# Patient Record
Sex: Female | Born: 1970
Health system: Southern US, Community
[De-identification: ages and names within clinical notes are randomized; demographics above are authoritative.]

## PROBLEM LIST (undated history)

## (undated) DIAGNOSIS — I10 Essential (primary) hypertension: Secondary | ICD-10-CM

## (undated) DIAGNOSIS — D649 Anemia, unspecified: Secondary | ICD-10-CM

## (undated) DIAGNOSIS — F32A Depression, unspecified: Secondary | ICD-10-CM

## (undated) DIAGNOSIS — F419 Anxiety disorder, unspecified: Secondary | ICD-10-CM

## (undated) DIAGNOSIS — K219 Gastro-esophageal reflux disease without esophagitis: Secondary | ICD-10-CM

## (undated) DIAGNOSIS — T7840XA Allergy, unspecified, initial encounter: Secondary | ICD-10-CM

## (undated) DIAGNOSIS — F329 Major depressive disorder, single episode, unspecified: Secondary | ICD-10-CM

## (undated) HISTORY — DX: Gastro-esophageal reflux disease without esophagitis: K21.9

## (undated) HISTORY — PX: WISDOM TOOTH EXTRACTION: SHX21

## (undated) HISTORY — DX: Anxiety disorder, unspecified: F41.9

## (undated) HISTORY — DX: Allergy, unspecified, initial encounter: T78.40XA

## (undated) HISTORY — DX: Major depressive disorder, single episode, unspecified: F32.9

## (undated) HISTORY — DX: Depression, unspecified: F32.A

## (undated) HISTORY — PX: CHOLECYSTECTOMY: SHX55

---

## 2005-08-31 ENCOUNTER — Emergency Department (HOSPITAL_COMMUNITY): Admission: EM | Admit: 2005-08-31 | Discharge: 2005-08-31 | Payer: Self-pay | Admitting: Emergency Medicine

## 2005-10-02 ENCOUNTER — Emergency Department (HOSPITAL_COMMUNITY): Admission: EM | Admit: 2005-10-02 | Discharge: 2005-10-02 | Payer: Self-pay | Admitting: Family Medicine

## 2008-03-22 ENCOUNTER — Emergency Department (HOSPITAL_BASED_OUTPATIENT_CLINIC_OR_DEPARTMENT_OTHER): Admission: EM | Admit: 2008-03-22 | Discharge: 2008-03-22 | Payer: Self-pay | Admitting: Emergency Medicine

## 2010-04-19 LAB — URINALYSIS, ROUTINE W REFLEX MICROSCOPIC
Bilirubin Urine: NEGATIVE
Glucose, UA: NEGATIVE mg/dL
Hgb urine dipstick: NEGATIVE
Ketones, ur: NEGATIVE mg/dL
Nitrite: NEGATIVE
Protein, ur: NEGATIVE mg/dL
Specific Gravity, Urine: 1.025 (ref 1.005–1.030)
Urobilinogen, UA: 1 mg/dL (ref 0.0–1.0)
pH: 6.5 (ref 5.0–8.0)

## 2010-04-19 LAB — DIFFERENTIAL
Basophils Absolute: 0.1 10*3/uL (ref 0.0–0.1)
Basophils Relative: 1 % (ref 0–1)
Eosinophils Absolute: 0.1 10*3/uL (ref 0.0–0.7)
Eosinophils Relative: 1 % (ref 0–5)
Lymphocytes Relative: 10 % — ABNORMAL LOW (ref 12–46)
Lymphs Abs: 1.1 10*3/uL (ref 0.7–4.0)
Monocytes Absolute: 0.5 10*3/uL (ref 0.1–1.0)
Monocytes Relative: 5 % (ref 3–12)
Neutro Abs: 9.1 10*3/uL — ABNORMAL HIGH (ref 1.7–7.7)
Neutrophils Relative %: 84 % — ABNORMAL HIGH (ref 43–77)

## 2010-04-19 LAB — URINE MICROSCOPIC-ADD ON

## 2010-04-19 LAB — BASIC METABOLIC PANEL
BUN: 14 mg/dL (ref 6–23)
CO2: 25 mEq/L (ref 19–32)
Calcium: 8.7 mg/dL (ref 8.4–10.5)
Chloride: 104 mEq/L (ref 96–112)
Creatinine, Ser: 0.8 mg/dL (ref 0.4–1.2)
GFR calc Af Amer: >60 mL/min (ref >60)
GFR calc non Af Amer: >60 mL/min (ref >60)
Glucose, Bld: 95 mg/dL (ref 70–99)
Potassium: 4.3 mEq/L (ref 3.5–5.1)
Sodium: 137 mEq/L (ref 135–145)

## 2010-04-19 LAB — CBC
HCT: 40.3 % (ref 36.0–46.0)
Hemoglobin: 13.1 g/dL (ref 12.0–15.0)
MCHC: 32.6 g/dL (ref 30.0–36.0)
MCV: 82 fL (ref 78.0–100.0)
Platelets: 208 10*3/uL (ref 150–400)
RBC: 4.92 MIL/uL (ref 3.87–5.11)
RDW: 13.3 % (ref 11.5–15.5)
WBC: 10.9 10*3/uL — ABNORMAL HIGH (ref 4.0–10.5)

## 2010-04-19 LAB — PREGNANCY, URINE: Preg Test, Ur: NEGATIVE

## 2011-01-26 ENCOUNTER — Ambulatory Visit (INDEPENDENT_AMBULATORY_CARE_PROVIDER_SITE_OTHER): Payer: BC Managed Care – PPO

## 2011-01-26 DIAGNOSIS — R030 Elevated blood-pressure reading, without diagnosis of hypertension: Secondary | ICD-10-CM

## 2011-01-26 DIAGNOSIS — K219 Gastro-esophageal reflux disease without esophagitis: Secondary | ICD-10-CM

## 2011-01-26 DIAGNOSIS — Z0000 Encounter for general adult medical examination without abnormal findings: Secondary | ICD-10-CM

## 2011-01-26 DIAGNOSIS — E669 Obesity, unspecified: Secondary | ICD-10-CM

## 2011-01-26 DIAGNOSIS — D649 Anemia, unspecified: Secondary | ICD-10-CM

## 2011-02-26 ENCOUNTER — Telehealth: Payer: Self-pay

## 2011-02-26 NOTE — Telephone Encounter (Signed)
Pt states that Dr Alwyn Ren put her on an anti-biotic and she now thinks that she has a yeast infection from it.  Pt would like to know if she could have something called in for it.

## 2011-02-28 MED ORDER — FLUCONAZOLE 150 MG PO TABS: 150.0000 mg | ORAL_TABLET | Freq: Once | ORAL | Status: AC

## 2011-02-28 NOTE — Telephone Encounter (Signed)
Rx for Diflucan sent.

## 2011-02-28 NOTE — Telephone Encounter (Signed)
Pt.notified

## 2011-05-14 ENCOUNTER — Ambulatory Visit (INDEPENDENT_AMBULATORY_CARE_PROVIDER_SITE_OTHER): Payer: BC Managed Care – PPO | Admitting: Internal Medicine

## 2011-05-14 VITALS — BP 151/96 | HR 95 | Temp 98.3°F | Resp 18 | Ht 66.0 in | Wt 218.2 lb

## 2011-05-14 DIAGNOSIS — L299 Pruritus, unspecified: Secondary | ICD-10-CM

## 2011-05-14 DIAGNOSIS — T148 Other injury of unspecified body region: Secondary | ICD-10-CM

## 2011-05-14 DIAGNOSIS — K219 Gastro-esophageal reflux disease without esophagitis: Secondary | ICD-10-CM | POA: Insufficient documentation

## 2011-05-14 DIAGNOSIS — W57XXXA Bitten or stung by nonvenomous insect and other nonvenomous arthropods, initial encounter: Secondary | ICD-10-CM

## 2011-05-14 DIAGNOSIS — R21 Rash and other nonspecific skin eruption: Secondary | ICD-10-CM

## 2011-05-14 MED ORDER — CLOBETASOL PROPIONATE 0.05 % EX CREA: TOPICAL_CREAM | Freq: Two times a day (BID) | CUTANEOUS | Status: AC

## 2011-05-14 MED ORDER — PREDNISONE 10 MG PO TABS: ORAL_TABLET | ORAL | Status: DC

## 2011-05-14 NOTE — Progress Notes (Signed)
  Subjective:    Patient ID: Angela Brooks, female    DOB: March 06, 1970, 41 y.o.   MRN: [id number]  HPI At a friends house and later noticed an itchy rash starting on legs and moving to arms.  Most areas are discrete bites, some are confluent. No fever and not sick. Very itchy   Review of Systems BP elevated will f/up with Dr. Alwyn Ren    Objective:   Physical Exam Rash on legs and arms typical of insect bites. Appears well otherwise       Assessment & Plan:  Zyrtec Clobetasol Prednisone po

## 2013-08-21 ENCOUNTER — Emergency Department (HOSPITAL_COMMUNITY)
Admission: EM | Admit: 2013-08-21 | Discharge: 2013-08-22 | Disposition: A | Discharge status: Home / Self Care | Payer: Self-pay | Attending: Emergency Medicine | Admitting: Emergency Medicine

## 2013-08-21 ENCOUNTER — Emergency Department (HOSPITAL_COMMUNITY): Payer: Self-pay

## 2013-08-21 ENCOUNTER — Encounter (HOSPITAL_COMMUNITY): Payer: Self-pay | Admitting: Emergency Medicine

## 2013-08-21 DIAGNOSIS — R079 Chest pain, unspecified: Secondary | ICD-10-CM | POA: Insufficient documentation

## 2013-08-21 DIAGNOSIS — Z79899 Other long term (current) drug therapy: Secondary | ICD-10-CM | POA: Insufficient documentation

## 2013-08-21 DIAGNOSIS — R252 Cramp and spasm: Secondary | ICD-10-CM | POA: Insufficient documentation

## 2013-08-21 DIAGNOSIS — Z3202 Encounter for pregnancy test, result negative: Secondary | ICD-10-CM | POA: Insufficient documentation

## 2013-08-21 DIAGNOSIS — R0789 Other chest pain: Secondary | ICD-10-CM | POA: Insufficient documentation

## 2013-08-21 DIAGNOSIS — I10 Essential (primary) hypertension: Secondary | ICD-10-CM | POA: Insufficient documentation

## 2013-08-21 HISTORY — DX: Essential (primary) hypertension: I10

## 2013-08-21 LAB — CBC
HCT: 31.9 % — ABNORMAL LOW (ref 36.0–46.0)
Hemoglobin: 9.7 g/dL — ABNORMAL LOW (ref 12.0–15.0)
MCH: 21.7 pg — ABNORMAL LOW (ref 26.0–34.0)
MCHC: 30.4 g/dL (ref 30.0–36.0)
MCV: 71.4 fL — ABNORMAL LOW (ref 78.0–100.0)
Platelets: 289 10*3/uL (ref 150–400)
RBC: 4.47 MIL/uL (ref 3.87–5.11)
RDW: 15.5 % (ref 11.5–15.5)
WBC: 13.6 10*3/uL — ABNORMAL HIGH (ref 4.0–10.5)

## 2013-08-21 LAB — BASIC METABOLIC PANEL
Anion gap: 14 (ref 5–15)
BUN: 10 mg/dL (ref 6–23)
CO2: 24 mEq/L (ref 19–32)
Calcium: 9.5 mg/dL (ref 8.4–10.5)
Chloride: 100 mEq/L (ref 96–112)
Creatinine, Ser: 0.99 mg/dL (ref 0.50–1.10)
GFR calc Af Amer: 79 mL/min — ABNORMAL LOW (ref >90)
GFR calc non Af Amer: 68 mL/min — ABNORMAL LOW (ref >90)
Glucose, Bld: 105 mg/dL — ABNORMAL HIGH (ref 70–99)
Potassium: 4.4 mEq/L (ref 3.7–5.3)
Sodium: 138 mEq/L (ref 137–147)

## 2013-08-21 LAB — POC URINE PREG, ED: Preg Test, Ur: NEGATIVE

## 2013-08-21 LAB — I-STAT TROPONIN, ED: Troponin i, poc: 0.02 ng/mL (ref 0.00–0.08)

## 2013-08-21 LAB — PRO B NATRIURETIC PEPTIDE: Pro B Natriuretic peptide (BNP): 49.7 pg/mL (ref 0–125)

## 2013-08-21 NOTE — ED Notes (Signed)
Pt states this afternoon she started to develop cramps in bilateral legs. States that cramps were so bad she called 911 who noted her BP to high. Pt states that she was able to walk at this point, so had family bring her to ED. While driving here she developed central chest "squeezing" with radiation to right arm, associated with mild SOB and weakness. Pt noted to be hypertensive, hx of same, denies taking any medications.

## 2013-08-21 NOTE — ED Notes (Addendum)
Pt asking to be XXX.  Requesting NO visitors.  The pt is advising the female party that is with her she does not want back either.

## 2013-08-22 LAB — URINALYSIS, ROUTINE W REFLEX MICROSCOPIC
Bilirubin Urine: NEGATIVE
Glucose, UA: NEGATIVE mg/dL
Hgb urine dipstick: NEGATIVE
Ketones, ur: NEGATIVE mg/dL
Leukocytes, UA: NEGATIVE
Nitrite: NEGATIVE
Protein, ur: NEGATIVE mg/dL
Specific Gravity, Urine: 1.009 (ref 1.005–1.030)
Urobilinogen, UA: 0.2 mg/dL (ref 0.0–1.0)
pH: 7 (ref 5.0–8.0)

## 2013-08-22 LAB — D-DIMER, QUANTITATIVE: D-Dimer, Quant: 0.37 ug/mL-FEU (ref 0.00–0.48)

## 2013-08-22 MED ORDER — NITROGLYCERIN 2 % TD OINT
1.0000 [in_us] | TOPICAL_OINTMENT | Freq: Once | TRANSDERMAL | Status: AC
  Administered 2013-08-22: 1 [in_us] via TOPICAL
  Filled 2013-08-22: qty 1

## 2013-08-22 MED ORDER — ASPIRIN 81 MG PO CHEW
324.0000 mg | CHEWABLE_TABLET | Freq: Once | ORAL | Status: AC
  Administered 2013-08-22: 324 mg via ORAL
  Filled 2013-08-22: qty 4

## 2013-08-22 NOTE — Discharge Instructions (Signed)

## 2013-08-22 NOTE — ED Provider Notes (Signed)
CSN: [id number]     Arrival date & time 08/21/13  2119 History   First MD Initiated Contact with Patient 08/22/13 0006     Chief Complaint  Patient presents with  . Chest Pain     (Consider location/radiation/quality/duration/timing/severity/associated sxs/prior Treatment) HPI  Patient is a 43 yo woman who presents with complaints of bilateral aching pain in her calves. She says she got a "Charlie horse" in her left calf and then her right after playing softball for about an hour, shortly PTA. She says that, despite, friends rubbing her legs, she was  Unable to shake the cramping which was severe. So, 911 was called. After paramedics arrived, she developed centrally located chest tightness which lasted < 49m and resolved without intervention. No SOB. No cough.   Patient has never played softball before. She reports normal po intake. She has not appreciated any swelling of the LE. She is concerned that she may have a blood clot. Other than HTN, she has no CAD RF. No VTE RF.   Past Medical History  Diagnosis Date  . Hypertension    Past Surgical History  Procedure Laterality Date  . Cholecystectomy     History reviewed. No pertinent family history. History  Substance Use Topics  . Smoking status: Never Smoker   . Smokeless tobacco: Not on file  . Alcohol Use: No   OB History   Grav Para Term Preterm Abortions TAB SAB Ect Mult Living   1    1  1   1      Review of Systems 10 point review of symptoms obtained and is negative with the exceptions of symptoms noted abov.e    Allergies  Review of patient's allergies indicates no known allergies.  Home Medications   Prior to Admission medications   Medication Sig Start Date End Date Taking? Authorizing Provider  pantoprazole (PROTONIX) 40 MG tablet Take 40 mg by mouth daily.    Historical Provider, MD  predniSONE (DELTASONE) 10 MG tablet Take 2 po bid for 3d, take 1 po bid for 3d, take 1 po qd for 2d pc for rash 05/14/11   Orma Flaming, MD   BP 177/105  Pulse 96  Temp(Src) 98.3 F (36.8 C) (Oral)  Resp 18  Ht 5\' 5"  (1.651 m)  Wt 192 lb (87.091 kg)  BMI 31.95 kg/m2  SpO2 100%  LMP 07/24/2013 Physical Exam Gen: well nourished and well developed appearing Head: NCAT Ears: normal to inspection Nose: normal to inspection, no epistaxis or drainage Mouth: oral mucsoa is well hydrated appearing, normal posterior oropharynx Neck: supple, no stridor CV: RRR, no murmur, palpable peripheral pulses Resp: lung sounds are clear to auscultation bilaterally, no wheeing or rhonchi or rales, normal respiratory effort.  Abd: soft, nontender, nondistended Extremities: normal to inspection, LE are  normal to inspection bilaterally, there is mild tenderness on palpation the calves bilaterally, DP pulses palpable bilaterally.  Skin: warm and dry Neuro: CN ii - XII, no focal deficitis Psyche; normal affect, cooperative.   ED Course  Procedures (including critical care time) Labs Review  Results for orders placed during the hospital encounter of 08/21/13 (from the past 24 hour(s))  CBC     Status: Abnormal   Collection Time    08/21/13  9:29 PM      Result Value Ref Range   WBC 13.6 (*) 4.0 - 10.5 K/uL   RBC 4.47  3.87 - 5.11 MIL/uL   Hemoglobin 9.7 (*) 12.0 - 15.0 g/dL  HCT 31.9 (*) 36.0 - 46.0 %   MCV 71.4 (*) 78.0 - 100.0 fL   MCH 21.7 (*) 26.0 - 34.0 pg   MCHC 30.4  30.0 - 36.0 g/dL   RDW 15.5  11.5 - 15.5 %   Platelets 289  150 - 400 K/uL  BASIC METABOLIC PANEL     Status: Abnormal   Collection Time    08/21/13  9:29 PM      Result Value Ref Range   Sodium 138  137 - 147 mEq/L   Potassium 4.4  3.7 - 5.3 mEq/L   Chloride 100  96 - 112 mEq/L   CO2 24  19 - 32 mEq/L   Glucose, Bld 105 (*) 70 - 99 mg/dL   BUN 10  6 - 23 mg/dL   Creatinine, Ser 0.99  0.50 - 1.10 mg/dL   Calcium 9.5  8.4 - 10.5 mg/dL   GFR calc non Af Amer 68 (*) >90 mL/min   GFR calc Af Amer 79 (*) >90 mL/min   Anion gap 14  5 - 15  PRO B  NATRIURETIC PEPTIDE     Status: None   Collection Time    08/21/13  9:29 PM      Result Value Ref Range   Pro B Natriuretic peptide (BNP) 49.7  0 - 125 pg/mL  I-STAT TROPOININ, ED     Status: None   Collection Time    08/21/13  9:55 PM      Result Value Ref Range   Troponin i, poc 0.02  0.00 - 0.08 ng/mL   Comment 3           POC URINE PREG, ED     Status: None   Collection Time    08/21/13 10:43 PM      Result Value Ref Range   Preg Test, Ur NEGATIVE  NEGATIVE  D-DIMER, QUANTITATIVE     Status: None   Collection Time    08/22/13 12:25 AM      Result Value Ref Range   D-Dimer, Quant 0.37  0.00 - 0.48 ug/mL-FEU  URINALYSIS, ROUTINE W REFLEX MICROSCOPIC     Status: None   Collection Time    08/22/13  1:23 AM      Result Value Ref Range   Color, Urine YELLOW  YELLOW   APPearance CLEAR  CLEAR   Specific Gravity, Urine 1.009  1.005 - 1.030   pH 7.0  5.0 - 8.0   Glucose, UA NEGATIVE  NEGATIVE mg/dL   Hgb urine dipstick NEGATIVE  NEGATIVE   Bilirubin Urine NEGATIVE  NEGATIVE   Ketones, ur NEGATIVE  NEGATIVE mg/dL   Protein, ur NEGATIVE  NEGATIVE mg/dL   Urobilinogen, UA 0.2  0.0 - 1.0 mg/dL   Nitrite NEGATIVE  NEGATIVE   Leukocytes, UA NEGATIVE  NEGATIVE     Imaging Review Dg Chest 2 View  08/21/2013   CLINICAL DATA:  Left-sided chest pain.  EXAM: CHEST  2 VIEW  COMPARISON:  08/31/2005  FINDINGS: The heart size and mediastinal contours are within normal limits. Both lungs are clear. The visualized skeletal structures are unremarkable.  IMPRESSION: No active cardiopulmonary disease.   Electronically Signed   By: Rolm Baptise M.D.   On: 08/21/2013 22:38    EKG: nsr, no acute ischemic changes, normal intervals, normal axis, normal qrs complex  MDM   ED work up non-diagnostic and notable for mild leukocytosis. The patient has normal EKG and CXR and troponin. Her d-dimer  is negative allowing [place] to exclude PE in this low pretest prob patient. Patient has ambulated in the ED and  has been asymtpomatic for the past couple of hours. I have discussed her discharge diagnosis with her. She is stable for d/c with plan for outpatient df/u.     Elyn Peers, MD 08/22/13 [phone]

## 2013-11-08 ENCOUNTER — Encounter (HOSPITAL_COMMUNITY): Payer: Self-pay | Admitting: Emergency Medicine

## 2014-02-06 ENCOUNTER — Ambulatory Visit (INDEPENDENT_AMBULATORY_CARE_PROVIDER_SITE_OTHER): Payer: 59 | Admitting: Internal Medicine

## 2014-02-06 ENCOUNTER — Other Ambulatory Visit: Payer: Self-pay | Admitting: Internal Medicine

## 2014-02-06 ENCOUNTER — Ambulatory Visit (INDEPENDENT_AMBULATORY_CARE_PROVIDER_SITE_OTHER): Payer: 59

## 2014-02-06 VITALS — BP 130/80 | HR 124 | Temp 99.3°F | Resp 17 | Ht 66.0 in | Wt 201.0 lb

## 2014-02-06 DIAGNOSIS — Z23 Encounter for immunization: Secondary | ICD-10-CM

## 2014-02-06 DIAGNOSIS — K047 Periapical abscess without sinus: Secondary | ICD-10-CM

## 2014-02-06 DIAGNOSIS — N946 Dysmenorrhea, unspecified: Secondary | ICD-10-CM

## 2014-02-06 DIAGNOSIS — Z0000 Encounter for general adult medical examination without abnormal findings: Secondary | ICD-10-CM

## 2014-02-06 DIAGNOSIS — M25561 Pain in right knee: Secondary | ICD-10-CM

## 2014-02-06 DIAGNOSIS — N920 Excessive and frequent menstruation with regular cycle: Secondary | ICD-10-CM

## 2014-02-06 DIAGNOSIS — K029 Dental caries, unspecified: Secondary | ICD-10-CM | POA: Insufficient documentation

## 2014-02-06 DIAGNOSIS — M25511 Pain in right shoulder: Secondary | ICD-10-CM

## 2014-02-06 DIAGNOSIS — D509 Iron deficiency anemia, unspecified: Secondary | ICD-10-CM

## 2014-02-06 LAB — COMPREHENSIVE METABOLIC PANEL
ALT: 17 U/L (ref 0–35)
AST: 16 U/L (ref 0–37)
Albumin: 3.9 g/dL (ref 3.5–5.2)
Alkaline Phosphatase: 65 U/L (ref 39–117)
BUN: 12 mg/dL (ref 6–23)
CO2: 28 mEq/L (ref 19–32)
Calcium: 9.1 mg/dL (ref 8.4–10.5)
Chloride: 103 mEq/L (ref 96–112)
Creat: 1 mg/dL (ref 0.50–1.10)
Glucose, Bld: 97 mg/dL (ref 70–99)
Potassium: 4.3 mEq/L (ref 3.5–5.3)
Sodium: 137 mEq/L (ref 135–145)
Total Bilirubin: 0.6 mg/dL (ref 0.2–1.2)
Total Protein: 6.9 g/dL (ref 6.0–8.3)

## 2014-02-06 LAB — POCT WET PREP WITH KOH
Clue Cells Wet Prep HPF POC: NEGATIVE
KOH Prep POC: NEGATIVE
Trichomonas, UA: NEGATIVE
Yeast Wet Prep HPF POC: NEGATIVE

## 2014-02-06 LAB — CBC WITH DIFFERENTIAL/PLATELET
Basophils Absolute: 0.1 10*3/uL (ref 0.0–0.1)
Basophils Relative: 1 % (ref 0–1)
Eosinophils Absolute: 0.2 10*3/uL (ref 0.0–0.7)
Eosinophils Relative: 3 % (ref 0–5)
HCT: 33.7 % — ABNORMAL LOW (ref 36.0–46.0)
Hemoglobin: 10.4 g/dL — ABNORMAL LOW (ref 12.0–15.0)
Lymphocytes Relative: 16 % (ref 12–46)
Lymphs Abs: 1.1 10*3/uL (ref 0.7–4.0)
MCH: 21.8 pg — ABNORMAL LOW (ref 26.0–34.0)
MCHC: 30.9 g/dL (ref 30.0–36.0)
MCV: 70.6 fL — ABNORMAL LOW (ref 78.0–100.0)
MPV: 9.8 fL (ref 8.6–12.4)
Monocytes Absolute: 0.6 10*3/uL (ref 0.1–1.0)
Monocytes Relative: 9 % (ref 3–12)
Neutro Abs: 4.7 10*3/uL (ref 1.7–7.7)
Neutrophils Relative %: 71 % (ref 43–77)
Platelets: 304 10*3/uL (ref 150–400)
RBC: 4.77 MIL/uL (ref 3.87–5.11)
RDW: 17.8 % — ABNORMAL HIGH (ref 11.5–15.5)
WBC: 6.6 10*3/uL (ref 4.0–10.5)

## 2014-02-06 LAB — LIPID PANEL
Cholesterol: 141 mg/dL (ref 0–200)
HDL: 48 mg/dL (ref >39)
LDL Cholesterol: 83 mg/dL (ref 0–99)
Total CHOL/HDL Ratio: 2.9 Ratio
Triglycerides: 49 mg/dL (ref <150)
VLDL: 10 mg/dL (ref 0–40)

## 2014-02-06 LAB — RPR

## 2014-02-06 LAB — HIV ANTIBODY (ROUTINE TESTING W REFLEX): HIV 1&2 Ab, 4th Generation: NONREACTIVE

## 2014-02-06 MED ORDER — MELOXICAM 15 MG PO TABS: 15.0000 mg | ORAL_TABLET | Freq: Every day | ORAL | Status: DC

## 2014-02-06 MED ORDER — HYDROCODONE-ACETAMINOPHEN 5-325 MG PO TABS: 1.0000 | ORAL_TABLET | Freq: Four times a day (QID) | ORAL | Status: DC | PRN

## 2014-02-06 MED ORDER — AMOXICILLIN 875 MG PO TABS: 875.0000 mg | ORAL_TABLET | Freq: Two times a day (BID) | ORAL | Status: DC

## 2014-02-06 NOTE — Progress Notes (Addendum)
Subjective:  This chart was scribed for Tami Lin, MD by Dellis Filbert, ED Scribe at [facility].The patient was seen in exam room 11 and the patient's care was started at 8:29 AM.   Patient ID: Angela Brooks, female    DOB: 04/30/1970, 44 y.o.   MRN: [id number] Chief Complaint  Patient presents with  . Annual Exam  . Knee Pain  . Dental Pain   HPI HPI Comments: Angela Brooks is a 44 y.o. female with a history of HTN with choice to stop meds and GERD who presents to [facility] at urging of family for CPE. She is complaining of dental pain, onset 2 weeks ago. She has right ear pain, sore throat and trouble sleeping as associated symptoms. The tooth has been swelling. Known caries. She also complains of constant right knee pain, onset 2 months ago. She denies trauma, but reports  one incident of her knee giving out. She states her knee feels like it is swollen and tender. Pt has trouble with ambulating.  She also has left sided neck pain and shoulder pain with a HA they get its worse as she works in a sitting position with computer   She has not had a mammogram, and is unsure of last tetanus shot.  She will have a PAP smear today, no recent vaginal problems. She would like to have screening for STDs but has not been recently sexually active and considers this her form of contraception.  She has regular menstrual cycles but they have increased in duration lasting about 7-10 days w/ 3-4 days with increased bleeding and cramping.  Pt works as a Museum/gallery conservator.  Patient Active Problem List   Diagnosis Date Noted  . GERD (gastroesophageal reflux disease) 05/14/2011   Past Medical History  Diagnosis Date  . Hypertension    Past Surgical History  Procedure Laterality Date  . Cholecystectomy     No Known Allergies Prior to Admission medications   Not on File   History   Social History  . Marital Status: Single    Spouse Name: N/A    Number of Children:  N/A  . Years of Education: N/A   Occupational History  . Not on file.   Social History Main Topics  . Smoking status: Never Smoker   . Smokeless tobacco: Not on file  . Alcohol Use: No  . Drug Use: No  . Sexual Activity: Yes    Birth Control/ Protection: None   Other Topics Concern  . Not on file   Social History Narrative   Review of Systems  HENT: Positive for dental problem, ear pain and sore throat.   Musculoskeletal: Positive for joint swelling, arthralgias, gait problem and neck pain.  Psychiatric/Behavioral: Positive for sleep disturbance.   remainder of the review of systems negative    Objective:  BP 130/80 mmHg  Pulse 124  Temp(Src) 99.3 F (37.4 C) (Oral)  Resp 17  Ht 5\' 6"  (1.676 m)  Wt 201 lb (91.173 kg)  BMI 32.46 kg/m2  SpO2 100%  LMP 01/23/2014  Physical Exam  Constitutional: She is oriented to person, place, and time. She appears well-developed and well-nourished. No distress.  HENT:  Head: Normocephalic and atraumatic.  Right Ear: External ear normal.  Left Ear: External ear normal.  Nose: Nose normal.  Mouth/Throat: Oropharynx is clear and moist.  Eyes: Conjunctivae and EOM are normal. Pupils are equal, round, and reactive to light.  Neck: Normal range of motion.  Neck supple. No thyromegaly present.  Cardiovascular: Normal rate, regular rhythm, normal heart sounds and intact distal pulses.   No murmur heard. Pulmonary/Chest: Effort normal and breath sounds normal. No respiratory distress. She has no wheezes.  Abdominal: Soft. Bowel sounds are normal. She exhibits no distension and no mass. There is no tenderness. There is no rebound.  Genitourinary:  Introitus clear Os clear Uterus mid position and not enlarged or tender No adnexal masses or tenderness  Musculoskeletal: Normal range of motion. She exhibits no edema or tenderness.  The right knee is not swollen but is tender to palpation along the medial joint line. There is a good range of  motion with no laxity to stressors. Patella ballots freely She is tender in the right trapezius with muscle tightness although the neck and shoulder both have full range of motion.  Lymphadenopathy:    She has no cervical adenopathy.  Neurological: She is alert and oriented to person, place, and time. She has normal reflexes. No cranial nerve deficit.  Skin: Skin is warm and dry. No rash noted.  Psychiatric: She has a normal mood and affect. Her behavior is normal. Judgment and thought content normal.  Nursing note and vitals reviewed. [facility] reading (PRIMARY) by  Dr. Laney Pastor normal knee exam Results for orders placed or performed in visit on 02/06/14  POCT Wet Prep with KOH  Result Value Ref Range   Trichomonas, UA Negative    Clue Cells Wet Prep HPF POC neg    Epithelial Wet Prep HPF POC 2-4    Yeast Wet Prep HPF POC neg    Bacteria Wet Prep HPF POC 1+    RBC Wet Prep HPF POC 0-2    WBC Wet Prep HPF POC 3-5    KOH Prep POC Negative         Assessment & Plan:  Annual physical exam - Plan: Pap IG, CT/NG NAA, and HPV (high risk), Comprehensive metabolic panel, Lipid panel, Tdap , HIV antibody, RPR  Knee pain, right - Plan: DG Knee 1-2 Views Right  Range of motion exercises/meloxicam/if not better in 1 month we'll refer for arthroscopy  Dysmenorrhea - Plan: POCT Wet Prep with KOH  Menorrhagia with regular cycle - Plan: CBC with Differential/Platelet  Pain in joint, shoulder region, right  Exercises to increase range of motion  Dental caries extending into pulp Dental abscess  Amoxicillin/pain meds/urgent dental consult  Need for Tdap vaccination - Plan: Tdap vaccine greater than or equal to 7yo IM  Meds ordered this encounter  Medications  . meloxicam (MOBIC) 15 MG tablet    Sig: Take 1 tablet (15 mg total) by mouth daily.    Dispense:  30 tablet    Refill:  0  . amoxicillin (AMOXIL) 875 MG tablet    Sig: Take 1 tablet (875 mg total) by mouth 2 (two) times daily.     Dispense:  20 tablet    Refill:  0  . HYDROcodone-acetaminophen (NORCO/VICODIN) 5-325 MG per tablet    Sig: Take 1 tablet by mouth every 6 (six) hours as needed for moderate pain.    Dispense:  20 tablet    Refill:  0    Notify labs and follow-up plans   I have completed the patient encounter in its entirety as documented by the scribe, with editing by me where necessary. Masayo Fera P. Laney Pastor, M.D.

## 2014-02-08 LAB — PAP IG, CT-NG NAA, HPV HIGH-RISK
Chlamydia Probe Amp: NEGATIVE
GC Probe Amp: NEGATIVE
HPV DNA High Risk: NOT DETECTED

## 2014-02-09 ENCOUNTER — Encounter: Payer: Self-pay | Admitting: Internal Medicine

## 2014-02-10 LAB — IRON AND TIBC
%SAT: 6 % — ABNORMAL LOW (ref 20–55)
Iron: 24 ug/dL — ABNORMAL LOW (ref 42–145)
TIBC: 402 ug/dL (ref 250–470)
UIBC: 378 ug/dL (ref 125–400)

## 2014-02-11 ENCOUNTER — Encounter: Payer: Self-pay | Admitting: Internal Medicine

## 2014-02-15 ENCOUNTER — Other Ambulatory Visit: Payer: Self-pay | Admitting: Internal Medicine

## 2014-02-15 DIAGNOSIS — Z1231 Encounter for screening mammogram for malignant neoplasm of breast: Secondary | ICD-10-CM

## 2014-03-02 ENCOUNTER — Other Ambulatory Visit: Payer: Self-pay | Admitting: Internal Medicine

## 2014-03-03 NOTE — Telephone Encounter (Signed)
Do you want to give RFs?

## 2014-03-28 ENCOUNTER — Ambulatory Visit (INDEPENDENT_AMBULATORY_CARE_PROVIDER_SITE_OTHER): Payer: 59 | Admitting: Internal Medicine

## 2014-03-28 ENCOUNTER — Telehealth: Payer: Self-pay

## 2014-03-28 VITALS — BP 173/120 | HR 112 | Temp 99.8°F | Resp 20 | Ht 65.0 in | Wt 200.1 lb

## 2014-03-28 DIAGNOSIS — M25561 Pain in right knee: Secondary | ICD-10-CM

## 2014-03-28 MED ORDER — ALPRAZOLAM 0.5 MG PO TABS: 0.5000 mg | ORAL_TABLET | Freq: Three times a day (TID) | ORAL | Status: DC | PRN

## 2014-03-28 NOTE — Telephone Encounter (Signed)
Spoke with patient advised she needs to come in and be evaluated before we could call in medication.  Pt understood.

## 2014-03-28 NOTE — Progress Notes (Signed)
   Subjective:    Patient ID: Angela Brooks, female    DOB: 05-24-1970, 44 y.o.   MRN: [id number] This chart was scribed for Angela Lin, MD by Marti Sleigh, Medical Scribe. This patient was seen in Room 3 and the patient's care was started a 9:03 PM.  Chief Complaint  Patient presents with  . Anxiety  . Hypertension    HPI HPI Comments: Angela Brooks is a 44 y.o. female who presents to [facility] complaining of anxiety and elevated blood pressure. She also continues to have significant right knee pain since treatment was started here in January.   Current stressors include: Pt states her daughter is currently under psychiatric care for threatening to kill herself. Pt states her daughter has been depressed. [employer]. [place]. Pt states her daughter had foot surgery twice since January, with failure of the prosthesis, and has been depressed since.  Pt also states that she recently lost her job and as a single mother is worried about covering all her bills. Pt states she has not been sleeping. Pt states she is crying a lot. Pt states she is very overwhelmed. No past history of treatment for depression or anxiety.  She has noticed that her blood pressure has been elevated since she has been so anxious.  Pt also states she is having continued right knee pain. Pt states she has been compliant with her Meloxicam prescription, but is continuing to have pain. Pt states she has started wearing a knee brace. Pain is the worse when she's trying to sleep at night.  Review of Systems  Psychiatric/Behavioral: Positive for sleep disturbance. The patient is nervous/anxious.    no headaches or vision changes No chest pain or palpitations No edema No shortness of breath No GI or GU symptoms     Objective:   Physical Exam  Constitutional: She is oriented to person, place, and time. She appears well-developed and well-nourished.  She is near tears  HENT:  Head: Normocephalic and atraumatic.  Eyes:  Conjunctivae and EOM are normal. Pupils are equal, round, and reactive to light.  Neck: Neck supple.  Cardiovascular: Normal rate, regular rhythm and normal heart sounds.   Pulmonary/Chest: Effort normal. No respiratory distress.  Musculoskeletal: She exhibits no edema.  The right knee exam is in change with continued tenderness along the joint line but no obvious effusion  Neurological: She is alert and oriented to person, place, and time. No cranial nerve deficit.  Skin: She is not diaphoretic.  Psychiatric: Her behavior is normal. Judgment and thought content normal.  Obviously overwhelmed. No suicide ideation. Has an optimistic sense of the future.  Nursing note and vitals reviewed.      Assessment & Plan:  Problem #1 acute anxiety-situational, including insomnia Start alprazolam particularly at bedtime Follow-up one month or sooner if worse Consider counseling She has adequate opportunities for reappointment Her daughter is in good hands in terms of care.  Problem #2 persistent right knee pain-chronic We'll refer to orthopedics  Meds ordered this encounter  Medications  . ALPRAZolam (XANAX) 0.5 MG tablet    Sig: Take 1 tablet (0.5 mg total) by mouth 3 (three) times daily as needed for anxiety.    Dispense:  90 tablet    Refill:  1      I have completed the patient encounter in its entirety as documented by the scribe, with editing by me where necessary. Hurley Sobel P. Laney Pastor, M.D.

## 2014-03-28 NOTE — Telephone Encounter (Signed)
Patient called in stating she saw Laney Pastor a few months ago and wanted to know if she could have something for aniextiy because her job just closed down and her daughter has been threating suicide the past week and she is feeling very overwhelmed right now and just feels like she needs something. She would like to be called back and told what he can do or call in for her if anything her name is [phone].   She uses [facility] on [address].  Marking high priority because she seemed very upset and i feel like she may need to be called by someone on the clinical staff.

## 2014-06-21 ENCOUNTER — Ambulatory Visit (INDEPENDENT_AMBULATORY_CARE_PROVIDER_SITE_OTHER): Payer: 59 | Admitting: Family Medicine

## 2014-06-21 VITALS — BP 162/102 | HR 80 | Temp 98.8°F | Resp 17 | Ht 65.5 in | Wt 192.0 lb

## 2014-06-21 DIAGNOSIS — D509 Iron deficiency anemia, unspecified: Secondary | ICD-10-CM

## 2014-06-21 DIAGNOSIS — A499 Bacterial infection, unspecified: Secondary | ICD-10-CM

## 2014-06-21 DIAGNOSIS — N898 Other specified noninflammatory disorders of vagina: Secondary | ICD-10-CM | POA: Diagnosis not present

## 2014-06-21 DIAGNOSIS — L298 Other pruritus: Secondary | ICD-10-CM | POA: Diagnosis not present

## 2014-06-21 DIAGNOSIS — B379 Candidiasis, unspecified: Secondary | ICD-10-CM | POA: Diagnosis not present

## 2014-06-21 DIAGNOSIS — Z113 Encounter for screening for infections with a predominantly sexual mode of transmission: Secondary | ICD-10-CM

## 2014-06-21 DIAGNOSIS — N760 Acute vaginitis: Secondary | ICD-10-CM | POA: Diagnosis not present

## 2014-06-21 DIAGNOSIS — I10 Essential (primary) hypertension: Secondary | ICD-10-CM | POA: Diagnosis not present

## 2014-06-21 DIAGNOSIS — B9689 Other specified bacterial agents as the cause of diseases classified elsewhere: Secondary | ICD-10-CM

## 2014-06-21 DIAGNOSIS — Q682 Congenital deformity of knee: Secondary | ICD-10-CM

## 2014-06-21 LAB — POCT CBC
Granulocyte percent: 72.5 %G (ref 37–80)
HCT, POC: 41 % (ref 37.7–47.9)
Hemoglobin: 13 g/dL (ref 12.2–16.2)
Lymph, poc: 2.8 (ref 0.6–3.4)
MCH, POC: 25.2 pg — AB (ref 27–31.2)
MCHC: 31.7 g/dL — AB (ref 31.8–35.4)
MCV: 79.3 fL — AB (ref 80–97)
MID (cbc): 0.6 (ref 0–0.9)
MPV: 8.2 fL (ref 0–99.8)
POC Granulocyte: 8.9 — AB (ref 2–6.9)
POC LYMPH PERCENT: 22.6 %L (ref 10–50)
POC MID %: 4.9 %M (ref 0–12)
Platelet Count, POC: 270 10*3/uL (ref 142–424)
RBC: 5.18 M/uL (ref 4.04–5.48)
RDW, POC: 16.6 %
WBC: 12.3 10*3/uL — AB (ref 4.6–10.2)

## 2014-06-21 LAB — POCT URINALYSIS DIPSTICK
Glucose, UA: NEGATIVE
Ketones, UA: 40
Leukocytes, UA: NEGATIVE
Nitrite, UA: NEGATIVE
Protein, UA: 30
Spec Grav, UA: >=1.03
Urobilinogen, UA: 0.2
pH, UA: 5

## 2014-06-21 LAB — POCT UA - MICROSCOPIC ONLY
Casts, Ur, LPF, POC: NEGATIVE
Crystals, Ur, HPF, POC: NEGATIVE
Mucus, UA: NEGATIVE
Yeast, UA: NEGATIVE

## 2014-06-21 LAB — POCT WET PREP WITH KOH
Clue Cells Wet Prep HPF POC: NEGATIVE
KOH Prep POC: POSITIVE
Trichomonas, UA: NEGATIVE
Yeast Wet Prep HPF POC: POSITIVE

## 2014-06-21 MED ORDER — FLUCONAZOLE 150 MG PO TABS: 150.0000 mg | ORAL_TABLET | Freq: Once | ORAL | Status: DC

## 2014-06-21 MED ORDER — METRONIDAZOLE 500 MG PO TABS: 500.0000 mg | ORAL_TABLET | Freq: Two times a day (BID) | ORAL | Status: DC

## 2014-06-21 MED ORDER — HYDROCHLOROTHIAZIDE 25 MG PO TABS: 25.0000 mg | ORAL_TABLET | Freq: Every day | ORAL | Status: DC

## 2014-06-21 MED ORDER — CYCLOBENZAPRINE HCL 10 MG PO TABS: 10.0000 mg | ORAL_TABLET | Freq: Every day | ORAL | Status: DC

## 2014-06-21 NOTE — Patient Instructions (Signed)
Bacterial Vaginosis Bacterial vaginosis is a vaginal infection that occurs when the normal balance of bacteria in the vagina is disrupted. It results from an overgrowth of certain bacteria. This is the most common vaginal infection in women of childbearing age. Treatment is important to prevent complications, especially in pregnant women, as it can cause a premature delivery. CAUSES  Bacterial vaginosis is caused by an increase in harmful bacteria that are normally present in smaller amounts in the vagina. Several different kinds of bacteria can cause bacterial vaginosis. However, the reason that the condition develops is not fully understood. RISK FACTORS Certain activities or behaviors can put you at an increased risk of developing bacterial vaginosis, including:  Having a new sex partner or multiple sex partners.  Douching.  Using an intrauterine device (IUD) for contraception. Women do not get bacterial vaginosis from toilet seats, bedding, swimming pools, or contact with objects around them. SIGNS AND SYMPTOMS  Some women with bacterial vaginosis have no signs or symptoms. Common symptoms include:  Grey vaginal discharge.  A fishlike odor with discharge, especially after sexual intercourse.  Itching or burning of the vagina and vulva.  Burning or pain with urination. DIAGNOSIS  Your health care provider will take a medical history and examine the vagina for signs of bacterial vaginosis. A sample of vaginal fluid may be taken. Your health care provider will look at this sample under a microscope to check for bacteria and abnormal cells. A vaginal pH test may also be done.  TREATMENT  Bacterial vaginosis may be treated with antibiotic medicines. These may be given in the form of a pill or a vaginal cream. A second round of antibiotics may be prescribed if the condition comes back after treatment.  HOME CARE INSTRUCTIONS   Only take over-the-counter or prescription medicines as  directed by your health care provider.  If antibiotic medicine was prescribed, take it as directed. Make sure you finish it even if you start to feel better.  Do not have sex until treatment is completed.  Tell all sexual partners that you have a vaginal infection. They should see their health care provider and be treated if they have problems, such as a mild rash or itching.  Practice safe sex by using condoms and only having one sex partner. SEEK MEDICAL CARE IF:   Your symptoms are not improving after 3 days of treatment.  You have increased discharge or pain.  You have a fever. MAKE SURE YOU:   Understand these instructions.  Will watch your condition.  Will get help right away if you are not doing well or get worse. FOR MORE INFORMATION  Centers for Disease Control and Prevention, Division of STD Prevention: www.cdc.gov/std American Sexual Health Association (ASHA): www.ashastd.org  Document Released: 12/24/2004 Document Revised: 10/14/2012 Document Reviewed: 08/05/2012 Exit[employer] Patient Information 2015 [employer]. This information is not intended to replace advice given to you by your health care provider. Make sure you discuss any questions you have with your health care provider.  

## 2014-06-21 NOTE — Progress Notes (Signed)
Chief Complaint:  Chief Complaint  Patient presents with  . Exposure to STD    itching    HPI: Angela Brooks is a 44 y.o. female who is here for: 1. Trichomoniasis exposure, she has a fling 3 nights in a row  with the same younger gentleman and did not use a condom. She regrets it now but was not thinking. Has never had an STD. Recently tested several months abcka nd was neg for G/C and HIV She would like to be tested for STDs. + vaginal itching, no bleeding  2. She has had high BPs in the past,  no meds were given to her for it. No CP, HA, vision changes, palpitations SOB BP Readings from Last 3 Encounters:  06/21/14 162/102  03/28/14 173/120  02/06/14 130/80   3. Knee pain bilaterally, she feels it mostly at night, she has a high patella on xray  but no other imaging abnormalities on last xrays in 01/2014. She was on mobic and that did not seem to help, she would like something stronger. NOw eakness, nubness or tingling, NKI>   4. Has a history of anemia, is taking iron off and on. No SEs   Past Medical History  Diagnosis Date  . Hypertension    Past Surgical History  Procedure Laterality Date  . Cholecystectomy     History   Social History  . Marital Status: Single    Spouse Name: N/A  . Number of Children: N/A  . Years of Education: N/A   Social History Main Topics  . Smoking status: Never Smoker   . Smokeless tobacco: Never Used  . Alcohol Use: No  . Drug Use: No  . Sexual Activity: Yes    Birth Control/ Protection: None   Other Topics Concern  . None   Social History Narrative   Family History  Problem Relation Age of Onset  . Hypertension Mother   . Diabetes Maternal Grandmother    No Known Allergies Prior to Admission medications   Medication Sig Start Date End Date Taking? Authorizing Provider  ALPRAZolam Duanne Moron) 0.5 MG tablet Take 1 tablet (0.5 mg total) by mouth 3 (three) times daily as needed for anxiety. 03/28/14  Yes Leandrew Koyanagi, MD  meloxicam (MOBIC) 15 MG tablet TAKE ONE TABLET BY MOUTH ONCE DAILY 03/03/14  Yes Leandrew Koyanagi, MD  HYDROcodone-acetaminophen (NORCO/VICODIN) 5-325 MG per tablet Take 1 tablet by mouth every 6 (six) hours as needed for moderate pain. Patient not taking: Reported on 06/21/2014 02/06/14   Leandrew Koyanagi, MD     ROS: The patient denies fevers, chills, night sweats, unintentional weight loss, chest pain, palpitations, wheezing, dyspnea on exertion, nausea, vomiting, abdominal pain, dysuria, hematuria, melena, numbness, weakness, or tingling.   All other systems have been reviewed and were otherwise negative with the exception of those mentioned in the HPI and as above.    PHYSICAL EXAM: Filed Vitals:   06/21/14 1841  BP: 162/102  Pulse: 80  Temp: 98.8 F (37.1 C)  Resp: 17   Filed Vitals:   06/21/14 1841  Height: 5' 5.5" (1.664 m)  Weight: 192 lb (87.091 kg)   Body mass index is 31.45 kg/(m^2).   General: Alert, no acute distress HEENT:  Normocephalic, atraumatic, oropharynx patent. EOMI, PERRLA, fundo exam normal Cardiovascular:  Regular rate and rhythm, no rubs murmurs or gallops.  No Carotid bruits, radial pulse intact. No pedal edema.  Respiratory: Clear to auscultation bilaterally.  No wheezes, rales, or rhonchi.  No cyanosis, no use of accessory musculature GI: No organomegaly, abdomen is soft and non-tender, positive bowel sounds.  No masses. Skin: No rashes. Neurologic: Facial musculature symmetric. Psychiatric: Patient is appropriate throughout our interaction. Lymphatic: No cervical lymphadenopathy Musculoskeletal: Gait intact. Bilateral knees No deformity, Neg ballotment Diffuse tenderness Full AROM, full PROM 5/5 strength, 2/2 DTRs ankle ,  Neg Lachman, Neg medial jt line tenderness, neg McMurray L spine normal ROM,  Straight leg negative Hip normal ROM   GU-+ copious amounts white dc, no CMT, no rashes, no lession , no masses Cervix normal  appearing, lower abd nontender    LABS: Results for orders placed or performed in visit on 06/21/14  POCT Wet Prep with KOH  Result Value Ref Range   Trichomonas, UA Negative    Clue Cells Wet Prep HPF POC neg    Epithelial Wet Prep HPF POC Moderate Few, Moderate, Many   Yeast Wet Prep HPF POC pos    Bacteria Wet Prep HPF POC Few Few   RBC Wet Prep HPF POC 0-2    WBC Wet Prep HPF POC 2-4    KOH Prep POC Positive   POCT CBC  Result Value Ref Range   WBC 12.3 (A) 4.6 - 10.2 K/uL   Lymph, poc 2.8 0.6 - 3.4   POC LYMPH PERCENT 22.6 10 - 50 %L   MID (cbc) 0.6 0 - 0.9   POC MID % 4.9 0 - 12 %M   POC Granulocyte 8.9 (A) 2 - 6.9   Granulocyte percent 72.5 37 - 80 %G   RBC 5.18 4.04 - 5.48 M/uL   Hemoglobin 13.0 12.2 - 16.2 g/dL   HCT, POC 41.0 37.7 - 47.9 %   MCV 79.3 (A) 80 - 97 fL   MCH, POC 25.2 (A) 27 - 31.2 pg   MCHC 31.7 (A) 31.8 - 35.4 g/dL   RDW, POC 16.6 %   Platelet Count, POC 270 142 - 424 K/uL   MPV 8.2 0 - 99.8 fL  POCT urinalysis dipstick  Result Value Ref Range   Color, UA yellow    Clarity, UA clear    Glucose, UA neg    Bilirubin, UA small    Ketones, UA 40    Spec Grav, UA >=1.030    Blood, UA trace    pH, UA 5.0    Protein, UA 30    Urobilinogen, UA 0.2    Nitrite, UA neg    Leukocytes, UA Negative Negative  POCT UA - Microscopic Only  Result Value Ref Range   WBC, Ur, HPF, POC 0-2    RBC, urine, microscopic 1-3    Bacteria, U Microscopic trace    Mucus, UA neg    Epithelial cells, urine per micros 2-4    Crystals, Ur, HPF, POC neg    Casts, Ur, LPF, POC neg    Yeast, UA neg      EKG/XRAY:   Primary read interpreted by Dr. Marin Comment at [facility].   ASSESSMENT/PLAN: Encounter Diagnoses  Name Primary?  . Screening for STD (sexually transmitted disease) Yes  . Vaginal itching   . Anemia, iron deficiency   . Essential hypertension   . Vaginal discharge   . Yeast infection   . Bacterial vaginosis   . Patella alta    ? Leukocytosis due to BV and  KOH infection Rx Flagyl for Trich exposure and also BV ( I told her to use FFlagyl 500  mg 4 tabs PO all at once day #1 to treat for trich exposure eventhough no trich on wet prep and then finish out for BV Falgyl BID) Rx Diflican, may repeat Dispense #2 Rx Flexeril for knees at night, we discussed different option that are less addictive than opiod use ie steroid injections CBC with currently normal Hgb, cont with iron since iron def anemia Rx HCTZ 25 mg daily FU in 1 month for HTN recheck or prn  Labs pending    Gross sideeffects, risk and benefits, and alternatives of medications d/w patient. Patient is aware that all medications have potential sideeffects and we are unable to predict every sideeffect or drug-drug interaction that may occur.  Jamile Rekowski, [place], DO 06/22/2014 11:35 AM

## 2014-06-22 LAB — COMPLETE METABOLIC PANEL WITH GFR
ALT: 18 U/L (ref 0–35)
AST: 20 U/L (ref 0–37)
Albumin: 4.7 g/dL (ref 3.5–5.2)
Alkaline Phosphatase: 64 U/L (ref 39–117)
BUN: 15 mg/dL (ref 6–23)
CO2: 26 mEq/L (ref 19–32)
Calcium: 9.6 mg/dL (ref 8.4–10.5)
Chloride: 105 mEq/L (ref 96–112)
Creat: 1.15 mg/dL — ABNORMAL HIGH (ref 0.50–1.10)
GFR, Est African American: 66 mL/min
GFR, Est Non African American: 58 mL/min — ABNORMAL LOW
Glucose, Bld: 84 mg/dL (ref 70–99)
Potassium: 3.7 mEq/L (ref 3.5–5.3)
Sodium: 141 mEq/L (ref 135–145)
Total Bilirubin: 0.8 mg/dL (ref 0.2–1.2)
Total Protein: 8.1 g/dL (ref 6.0–8.3)

## 2014-06-22 LAB — RPR

## 2014-06-22 LAB — HIV ANTIBODY (ROUTINE TESTING W REFLEX): HIV 1&2 Ab, 4th Generation: NONREACTIVE

## 2014-06-23 LAB — HSV(HERPES SIMPLEX VRS) I + II AB-IGG
HSV 1 Glycoprotein G Ab, IgG: <0.1 IV
HSV 2 Glycoprotein G Ab, IgG: 11.89 IV — ABNORMAL HIGH

## 2014-06-23 LAB — GC/CHLAMYDIA PROBE AMP
CT Probe RNA: NEGATIVE
GC Probe RNA: NEGATIVE

## 2014-06-24 ENCOUNTER — Encounter: Payer: Self-pay | Admitting: Family Medicine

## 2014-08-11 ENCOUNTER — Other Ambulatory Visit: Payer: Self-pay | Admitting: Family Medicine

## 2014-09-14 ENCOUNTER — Other Ambulatory Visit: Payer: Self-pay | Admitting: Family Medicine

## 2014-09-18 ENCOUNTER — Other Ambulatory Visit: Payer: Self-pay | Admitting: Family Medicine

## 2014-09-28 ENCOUNTER — Ambulatory Visit (INDEPENDENT_AMBULATORY_CARE_PROVIDER_SITE_OTHER): Payer: 59 | Admitting: Family Medicine

## 2014-09-28 VITALS — BP 134/82 | HR 73 | Temp 98.2°F | Resp 18 | Ht 65.25 in | Wt 190.4 lb

## 2014-09-28 DIAGNOSIS — I10 Essential (primary) hypertension: Secondary | ICD-10-CM

## 2014-09-28 DIAGNOSIS — F4321 Adjustment disorder with depressed mood: Secondary | ICD-10-CM | POA: Diagnosis not present

## 2014-09-28 DIAGNOSIS — Z3200 Encounter for pregnancy test, result unknown: Secondary | ICD-10-CM | POA: Diagnosis not present

## 2014-09-28 LAB — POCT URINE PREGNANCY: Preg Test, Ur: NEGATIVE

## 2014-09-28 MED ORDER — ALPRAZOLAM 0.5 MG PO TABS: 0.5000 mg | ORAL_TABLET | Freq: Three times a day (TID) | ORAL | Status: DC | PRN

## 2014-09-28 MED ORDER — HYDROCHLOROTHIAZIDE 25 MG PO TABS: 25.0000 mg | ORAL_TABLET | Freq: Every day | ORAL | Status: DC

## 2014-09-28 NOTE — Patient Instructions (Signed)
We are sorry about the loss of your mother.  Please let us know if there is anything else we can do for you.    Thanks, Dr. Awanda Mink

## 2014-09-28 NOTE — Progress Notes (Signed)
Angela Brooks is a 44 y.o. female who presents today for grief and high blood pressure.    Grief - Pt recently lost her mother about one month ago.  Having hard time adjusting to this but functioning well considering the circumstances.  Denies SI/HI.  Good family/coping mechanisms, no weapons in house.    HTN - Pt with HTN for about the last two years.  Has been stable on HCTZ w/o ADR.  BP has been controlled as well.   Possible Pregnancy - Pt unsure if she could be pregnant and wants to be tested.  Denies hematuria, dysuria, increased frequency.     Past Medical History  Diagnosis Date  . Hypertension     History  Smoking status  . Never Smoker   Smokeless tobacco  . Never Used    Family History  Problem Relation Age of Onset  . Hypertension Mother   . Diabetes Maternal Grandmother     Current Outpatient Prescriptions on File Prior to Visit  Medication Sig Dispense Refill  . cyclobenzaprine (FLEXERIL) 10 MG tablet Take 1 tablet (10 mg total) by mouth at bedtime. Prn for knee pain/spasms 30 tablet 1  . hydrochlorothiazide (HYDRODIURIL) 25 MG tablet Take 1 tablet (25 mg total) by mouth daily. PATIENT NEEDS OFFICE VISIT FOR ADDITIONAL REFILLS 30 tablet 0  . ALPRAZolam (XANAX) 0.5 MG tablet Take 1 tablet (0.5 mg total) by mouth 3 (three) times daily as needed for anxiety. (Patient not taking: Reported on 09/28/2014) 90 tablet 1  . meloxicam (MOBIC) 15 MG tablet TAKE ONE TABLET BY MOUTH ONCE DAILY (Patient not taking: Reported on 09/28/2014) 30 tablet 4  . metroNIDAZOLE (FLAGYL) 500 MG tablet Take 1 tablet (500 mg total) by mouth 2 (two) times daily. (Patient not taking: Reported on 09/28/2014) 14 tablet 0   No current facility-administered medications on file prior to visit.    ROS: Per HPI.  All other systems reviewed and are negative.   Physical Exam Filed Vitals:   09/28/14 1823  BP: 134/82  Pulse: 73  Temp: 98.2 F (36.8 C)  Resp: 18    Physical Examination: General  appearance - alert, well appearing, and in no distress Chest - clear to auscultation, no wheezes, rales or rhonchi, symmetric air entry Heart - normal rate and regular rhythm, no murmurs noted    Chemistry      Component Value Date/Time   NA 141 06/21/2014 1926   K 3.7 06/21/2014 1926   CL 105 06/21/2014 1926   CO2 26 06/21/2014 1926   BUN 15 06/21/2014 1926   CREATININE 1.15* 06/21/2014 1926   CREATININE 0.99 08/21/2013 2129      Component Value Date/Time   CALCIUM 9.6 06/21/2014 1926   ALKPHOS 64 06/21/2014 1926   AST 20 06/21/2014 1926   ALT 18 06/21/2014 1926   BILITOT 0.8 06/21/2014 1926      Lab Results  Component Value Date   WBC 12.3* 06/21/2014   HGB 13.0 06/21/2014   HCT 41.0 06/21/2014   MCV 79.3* 06/21/2014   PLT 304 02/06/2014   No results found for: TSH No results found for: HGBA1C

## 2014-09-28 NOTE — Assessment & Plan Note (Signed)
Will give temporary Rx for xanax as she is having trouble sleeping If continues, will need more formal assessment of ongoing issues.

## 2014-09-28 NOTE — Assessment & Plan Note (Signed)
Refill on HCTZ Check Creatinine as elevated in June 2016.  If still elevated, consider starting w/u including renal US and P:C ratio.  Most likely etiology is long standing HTN however.

## 2014-09-29 LAB — BASIC METABOLIC PANEL
BUN: 20 mg/dL (ref 7–25)
CO2: 29 mmol/L (ref 20–31)
Calcium: 9.6 mg/dL (ref 8.6–10.2)
Chloride: 99 mmol/L (ref 98–110)
Creat: 1.07 mg/dL (ref 0.50–1.10)
Glucose, Bld: 94 mg/dL (ref 65–99)
Potassium: 3.7 mmol/L (ref 3.5–5.3)
Sodium: 137 mmol/L (ref 135–146)

## 2014-10-31 NOTE — Progress Notes (Signed)
History and physical examinations reviewed; agree with assessment and plan. Creatinine improved.   Evelynn Hench Elayne Guerin, M.D. [facility] [address] [place], [place]  [address] [phone] phone [phone] fax

## 2015-06-10 ENCOUNTER — Ambulatory Visit (INDEPENDENT_AMBULATORY_CARE_PROVIDER_SITE_OTHER): Payer: BLUE CROSS/BLUE SHIELD | Admitting: Physician Assistant

## 2015-06-10 VITALS — BP 114/74 | HR 87 | Temp 98.2°F | Resp 17 | Ht 67.0 in | Wt 189.0 lb

## 2015-06-10 DIAGNOSIS — Z91048 Other nonmedicinal substance allergy status: Secondary | ICD-10-CM

## 2015-06-10 DIAGNOSIS — Z9109 Other allergy status, other than to drugs and biological substances: Secondary | ICD-10-CM

## 2015-06-10 DIAGNOSIS — F4321 Adjustment disorder with depressed mood: Secondary | ICD-10-CM | POA: Diagnosis not present

## 2015-06-10 DIAGNOSIS — R300 Dysuria: Secondary | ICD-10-CM | POA: Diagnosis not present

## 2015-06-10 DIAGNOSIS — J019 Acute sinusitis, unspecified: Secondary | ICD-10-CM

## 2015-06-10 DIAGNOSIS — R05 Cough: Secondary | ICD-10-CM

## 2015-06-10 DIAGNOSIS — K219 Gastro-esophageal reflux disease without esophagitis: Secondary | ICD-10-CM

## 2015-06-10 DIAGNOSIS — R059 Cough, unspecified: Secondary | ICD-10-CM

## 2015-06-10 LAB — POCT URINALYSIS DIP (MANUAL ENTRY)
Bilirubin, UA: NEGATIVE
Glucose, UA: NEGATIVE
Ketones, POC UA: NEGATIVE
Nitrite, UA: NEGATIVE
Protein Ur, POC: 100 — AB
Spec Grav, UA: 1.015
Urobilinogen, UA: 1
pH, UA: 8.5

## 2015-06-10 LAB — POC MICROSCOPIC URINALYSIS (UMFC)

## 2015-06-10 LAB — POCT URINE PREGNANCY: Preg Test, Ur: NEGATIVE

## 2015-06-10 MED ORDER — HYDROCOD POLST-CPM POLST ER 10-8 MG/5ML PO SUER: 5.0000 mL | Freq: Every evening | ORAL | Status: DC | PRN

## 2015-06-10 MED ORDER — RANITIDINE HCL 150 MG PO TABS: 150.0000 mg | ORAL_TABLET | Freq: Two times a day (BID) | ORAL | Status: DC

## 2015-06-10 MED ORDER — CETIRIZINE HCL 10 MG PO TABS: 10.0000 mg | ORAL_TABLET | Freq: Every day | ORAL | Status: DC

## 2015-06-10 MED ORDER — OMEPRAZOLE 20 MG PO CPDR: 20.0000 mg | DELAYED_RELEASE_CAPSULE | Freq: Every day | ORAL | Status: DC

## 2015-06-10 MED ORDER — AMOXICILLIN-POT CLAVULANATE 875-125 MG PO TABS: 1.0000 | ORAL_TABLET | Freq: Two times a day (BID) | ORAL | Status: AC

## 2015-06-10 NOTE — Progress Notes (Signed)
[facility] [address], [place] [address] [phone]  Date:  06/10/2015   Name:  Angela Brooks   DOB:  Oct 26, 1970   MRN:  [id number]  PCP:  No PCP Per Patient    History of Present Illness:  Angela Brooks is a 45 y.o. female patient who presents to [facility] for urinary pressure, grief, and reflux  Pressure when your urinating, for 3 days.  No blood in urine.  There is frequency.  She has nausea, no abdominal pain.  No vaginal discharge.  She has a boyfriend whom she is unprotected sexual active with.  Patient's last menstrual period was 05/11/2015.  Reflux: hx of GERD.  She was taking prilosec.  Nausea and vomiting.  She will throw up with lettuce, tomatoes.  No blood in the vomit.  She has some heart burn--this is not associated with exertion      Sinus pressure 2 weeks.  No sneezing.  No watery eyes, but has some itching.  Sudafed, mucinex.  She is currently not taking any allergy.  She is coughing, non-productive.  No sob or dyspnea.   Grief: Mother's day and her birthday coming up, she has had more of a sense of longing and missing her deceased mother that passed about 10 months ago. She has noticed lability, and loss of energy.  She denies loss of interest, guilt, si/hi.  She has her friend and sister who are very supportive, whom she talks too.  Her daughter, 38, was recently diagnosed with bipolar with schizophrenic behaviors at time.  This has an added stressor.    No sob, dyspnea,     Patient Active Problem List   Diagnosis Date Noted  . Grief 09/28/2014  . Essential hypertension 09/28/2014  . Dental caries extending into pulp 02/06/2014  . GERD (gastroesophageal reflux disease) 05/14/2011    Past Medical History  Diagnosis Date  . Hypertension   . Allergy   . Anxiety   . Depression   . GERD (gastroesophageal reflux disease)     Past Surgical History  Procedure Laterality Date  . Cholecystectomy      Social History  Substance Use Topics   . Smoking status: Never Smoker   . Smokeless tobacco: Never Used  . Alcohol Use: No    Family History  Problem Relation Age of Onset  . Hypertension Mother   . Diabetes Maternal Grandmother     No Known Allergies  Medication list has been reviewed and updated.  Current Outpatient Prescriptions on File Prior to Visit  Medication Sig Dispense Refill  . hydrochlorothiazide (HYDRODIURIL) 25 MG tablet Take 1 tablet (25 mg total) by mouth daily. PATIENT NEEDS OFFICE VISIT FOR ADDITIONAL REFILLS 90 tablet 3  . ALPRAZolam (XANAX) 0.5 MG tablet Take 1 tablet (0.5 mg total) by mouth 3 (three) times daily as needed for anxiety. (Patient not taking: Reported on 06/10/2015) 90 tablet 1  . cyclobenzaprine (FLEXERIL) 10 MG tablet Take 1 tablet (10 mg total) by mouth at bedtime. Prn for knee pain/spasms (Patient not taking: Reported on 06/10/2015) 30 tablet 1   No current facility-administered medications on file prior to visit.    ROS ROS otherwise unremarkable unless listed above.   Physical Examination: BP 114/74 mmHg  Pulse 87  Temp(Src) 98.2 F (36.8 C) (Oral)  Resp 17  Ht 5\' 7"  (1.702 m)  Wt 189 lb (85.73 kg)  BMI 29.59 kg/m2  SpO2 99%  LMP 05/11/2015 Ideal Body Weight: Weight in (lb)  to have BMI = 25: 159.3  Physical Exam  Constitutional: She is oriented to person, place, and time. She appears well-developed and well-nourished. No distress.  HENT:  Head: Normocephalic and atraumatic.  Right Ear: Tympanic membrane, external ear and ear canal normal.  Left Ear: Tympanic membrane, external ear and ear canal normal.  Nose: Mucosal edema and rhinorrhea present. Right sinus exhibits maxillary sinus tenderness. Right sinus exhibits no frontal sinus tenderness. Left sinus exhibits maxillary sinus tenderness. Left sinus exhibits no frontal sinus tenderness.  Mouth/Throat: No uvula swelling. No oropharyngeal exudate, posterior oropharyngeal edema or posterior oropharyngeal erythema.  Eyes:  Conjunctivae and EOM are normal. Pupils are equal, round, and reactive to light.  Cardiovascular: Normal rate and regular rhythm.  Exam reveals no gallop, no distant heart sounds and no friction rub.   No murmur heard. Pulmonary/Chest: Effort normal. No respiratory distress. She has no decreased breath sounds. She has no wheezes. She has no rhonchi.  Abdominal: Soft. Normal appearance and bowel sounds are normal. There is tenderness in the suprapubic area.  Lymphadenopathy:       Head (right side): No submandibular, no tonsillar, no preauricular and no posterior auricular adenopathy present.       Head (left side): No submandibular, no tonsillar, no preauricular and no posterior auricular adenopathy present.  Neurological: She is alert and oriented to person, place, and time.  Skin: She is not diaphoretic.  Psychiatric: She has a normal mood and affect. Her behavior is normal.    Results for orders placed or performed in visit on 06/10/15  Urine culture  Result Value Ref Range   Culture STAPHYLOCOCCUS SAPROPHYTICUS CITROBACTER KOSERI     Colony Count >=100,000 COLONIES/ML    Organism ID, Bacteria STAPHYLOCOCCUS SAPROPHYTICUS    Organism ID, Bacteria CITROBACTER KOSERI       Susceptibility   Staphylococcus saprophyticus -  (no method available)    AMOX/CLAVULANIC 4 Sensitive     PIP/TAZO <=4 Sensitive     IMIPENEM <=0.25 Sensitive     CEFAZOLIN <=4 Not Reportable     CEFTRIAXONE <=1 Sensitive     CEFTAZIDIME <=1 Sensitive     CEFEPIME <=1 Sensitive     GENTAMICIN <=1 Sensitive     TOBRAMYCIN <=1 Sensitive     CIPROFLOXACIN <=0.25 Sensitive     LEVOFLOXACIN <=0.12 Sensitive     NITROFURANTOIN 32 Sensitive     TRIMETH/SULFA* <=20 Sensitive      * NR=NOT REPORTABLE,SEE COMMENTORAL therapy:A cefazolin MIC of <32 predicts susceptibility to the oral agents cefaclor,cefdinir,cefpodoxime,cefprozil,cefuroxime,cephalexin,and loracarbef when used for therapy of uncomplicated UTIs due to  E.coli,K.pneumomiae,and P.mirabilis. PARENTERAL therapy: A cefazolinMIC of >8 indicates resistance to parenteralcefazolin. An alternate test method must beperformed to confirm susceptibility to parenteralcefazolin.  GC/Chlamydia Probe Amp  Result Value Ref Range   CT Probe RNA NOT DETECTED    GC Probe RNA NOT DETECTED   POCT urinalysis dipstick  Result Value Ref Range   Color, UA yellow yellow   Clarity, UA cloudy (A) clear   Glucose, UA negative negative   Bilirubin, UA negative negative   Ketones, POC UA negative negative   Spec Grav, UA 1.015    Blood, UA trace-intact (A) negative   pH, UA 8.5    Protein Ur, POC =100 (A) negative   Urobilinogen, UA 1.0    Nitrite, UA Negative Negative   Leukocytes, UA moderate (2+) (A) Negative  POCT Microscopic Urinalysis ([facility])  Result Value Ref Range   WBC,UR,HPF,POC Too numerous to  count  (A) None WBC/hpf   RBC,UR,HPF,POC Few (A) None RBC/hpf   Bacteria Moderate (A) None, Too numerous to count   Mucus Present (A) Absent   Epithelial Cells, UR Per Microscopy Moderate (A) None, Too numerous to count cells/hpf  POCT urine pregnancy  Result Value Ref Range   Preg Test, Ur Negative Negative    Assessment and Plan: Angela Brooks is a 45 y.o. female who is here today for cc of dysuria, sinus pain, allergies, and reflux. Started on augmentin to dualy treat possible sinusitis. Will also start reflux medication.  Advised to start allergy medication.  She declines anti-depressant at this time.  i have advised to let me know if she would like a psychologist.  Dysuria - Plan: POCT urinalysis dipstick, POCT Microscopic Urinalysis ([facility]), POCT urine pregnancy, Urine culture, amoxicillin-clavulanate (AUGMENTIN) 875-125 MG tablet, GC/Chlamydia Probe Amp  Subacute sinusitis, unspecified location - Plan: amoxicillin-clavulanate (AUGMENTIN) 875-125 MG tablet  Gastroesophageal reflux disease, esophagitis presence not specified - Plan: ranitidine (ZANTAC)  150 MG tablet, omeprazole (PRILOSEC) 20 MG capsule  Environmental allergies - Plan: cetirizine (ZYRTEC) 10 MG tablet  Cough - Plan: chlorpheniramine-HYDROcodone (TUSSIONEX PENNKINETIC ER) 10-8 MG/5ML SUER  Ivar Drape, PA-C [facility] 06/10/2015 8:37 AM

## 2015-06-10 NOTE — Patient Instructions (Addendum)
IF you received an x-ray today, you will receive an invoice from [facility]. Please contact [facility] at [phone] with questions or concerns regarding your invoice.   IF you received labwork today, you will receive an invoice from [employer]. Please contact [facility] at [phone] with questions or concerns regarding your invoice.   Our billing staff will not be able to assist you with questions regarding bills from these companies.  You will be contacted with the lab results as soon as they are available. The fastest way to get your results is to activate your My Chart account. Instructions are located on the last page of this paperwork. If you have not heard from [place] regarding the results in 2 weeks, please contact this office.     We recommend that you schedule a mammogram for breast cancer screening. Typically, you do not need a referral to do this. Please contact a local imaging center to schedule your mammogram.  [facility] - [phone]  *ask for the Radiology Department [facility] ([place]) - [phone] or [phone]  [facility] - [phone] [place] [phone] [facility] - [phone]  *ask for the [facility] - [phone]  *ask for the Radiology Department [facility] - [phone]  *ask for the [facility] - [phone] Hydrate well with water.  I would like you to take the cetirizine for the month.  If your congestion, sinus pressure returns, you need to return to see [place].   Please follow the GERD diet restrictions, and take medication for up to 1 month.  If it continues, please let me know.  If the symptoms return, start back on your medications.   I would like you to let me know if you would like additional counseling.  You can contact me by phone if  needed. I will have the results of your labs, within 7 days.  Complicated Grieving Grief is a normal response to the death of someone close to you. Feelings of fear, anger, and guilt can affect almost everyone who loses a loved one. It is also common to have symptoms of depression while you are grieving. These include problems with sleep, loss of appetite, and lack of energy. They may last for weeks or months after a loss. Complicated grief is different from normal grief or depression. Normal grieving involves sadness and feelings of loss, but these feelings are not constant. Complicated grief is a constant and severe type of grief. It interferes with your ability to function normally. It may last for several months to a year or longer. Complicated grief may require treatment from a mental health care provider. CAUSES  It is not known why some people continue to struggle with grief and others do not. You may be at higher risk for complicated grief if:  The death of your loved one was sudden or unexpected.  The death of your loved one was due to a violent event.  Your loved one committed suicide.  Your loved one was a child or a young person.  You were very close to or dependent on the loved one.  You have a history of depression. SIGNS AND SYMPTOMS Signs and symptoms of complicated grief may include:  Feeling disbelief or numbness.  Being unable to enjoy good memories of your loved one.  Needing to avoid anything that reminds you of  your loved one.  Being unable to stop thinking about the death.  Feeling intense anger or guilt.  Feeling alone and hopeless.  Feeling that your life is meaningless and empty.  Losing the desire to live. DIAGNOSIS Your health care provider may diagnose complicated grief if:  You have constant symptoms of grief for 6-12 months or longer.  Your symptoms are interfering with your ability to live your life. Your health care provider may want you to  see a mental health care provider. Many symptoms of depression are similar to the symptoms of complicated grief. It is important to be evaluated for complicated grief along with other mental health conditions. TREATMENT  Talk therapy with a mental health provider is the most common treatment for complicated grief. During therapy, you will learn healthy ways to cope with the loss of your loved one. In some cases, your mental health care provider may also recommend antidepressant medicines. HOME CARE INSTRUCTIONS  Take care of yourself.  Eat regular meals and maintain a healthy diet. Eat plenty of fruits, vegetables, and whole grains.  Try to get some exercise each day.  Keep regular hours for sleep. Try to get at least 8 hours of sleep each night.  Do not use drugs or alcohol to ease your symptoms.  Take medicines only as directed by your health care provider.  Spend time with friends and loved ones.  Consider joining a grief (bereavement) support group to help you deal with your loss.  Keep all follow-up visits as directed by your health care provider. This is important. SEEK MEDICAL CARE IF:  Your symptoms keep you from functioning normally.  Your symptoms do not get better with treatment. SEEK IMMEDIATE MEDICAL CARE IF:  You have serious thoughts of hurting yourself or someone else.  You have suicidal feelings.   This information is not intended to replace advice given to you by your health care provider. Make sure you discuss any questions you have with your health care provider.   Document Released: 12/24/2004 Document Revised: 09/14/2014 Document Reviewed: 06/03/2013 Elsevier Interactive Patient Education 2016 Elsevier Inc.  Sinusitis, Adult Sinusitis is redness, soreness, and inflammation of the paranasal sinuses. Paranasal sinuses are air pockets within the bones of your face. They are located beneath your eyes, in the middle of your forehead, and above your eyes. In  healthy paranasal sinuses, mucus is able to drain out, and air is able to circulate through them by way of your nose. However, when your paranasal sinuses are inflamed, mucus and air can become trapped. This can allow bacteria and other germs to grow and cause infection. Sinusitis can develop quickly and last only a short time (acute) or continue over a long period (chronic). Sinusitis that lasts for more than 12 weeks is considered chronic. CAUSES Causes of sinusitis include:  Allergies.  Structural abnormalities, such as displacement of the cartilage that separates your nostrils (deviated septum), which can decrease the air flow through your nose and sinuses and affect sinus drainage.  Functional abnormalities, such as when the small hairs (cilia) that line your sinuses and help remove mucus do not work properly or are not present. SIGNS AND SYMPTOMS Symptoms of acute and chronic sinusitis are the same. The primary symptoms are pain and pressure around the affected sinuses. Other symptoms include:  Upper toothache.  Earache.  Headache.  Bad breath.  Decreased sense of smell and taste.  A cough, which worsens when you are lying flat.  Fatigue.  Fever.  Thick drainage from your nose, which often is green and may contain pus (purulent).  Swelling and warmth over the affected sinuses. DIAGNOSIS Your health care provider will perform a physical exam. During your exam, your health care provider may perform any of the following to help determine if you have acute sinusitis or chronic sinusitis:  Look in your nose for signs of abnormal growths in your nostrils (nasal polyps).  Tap over the affected sinus to check for signs of infection.  View the inside of your sinuses using an imaging device that has a light attached (endoscope). If your health care provider suspects that you have chronic sinusitis, one or more of the following tests may be recommended:  Allergy tests.  Nasal  culture. A sample of mucus is taken from your nose, sent to a lab, and screened for bacteria.  Nasal cytology. A sample of mucus is taken from your nose and examined by your health care provider to determine if your sinusitis is related to an allergy. TREATMENT Most cases of acute sinusitis are related to a viral infection and will resolve on their own within 10 days. Sometimes, medicines are prescribed to help relieve symptoms of both acute and chronic sinusitis. These may include pain medicines, decongestants, nasal steroid sprays, or saline sprays. However, for sinusitis related to a bacterial infection, your health care provider will prescribe antibiotic medicines. These are medicines that will help kill the bacteria causing the infection. Rarely, sinusitis is caused by a fungal infection. In these cases, your health care provider will prescribe antifungal medicine. For some cases of chronic sinusitis, surgery is needed. Generally, these are cases in which sinusitis recurs more than 3 times per year, despite other treatments. HOME CARE INSTRUCTIONS  Drink plenty of water. Water helps thin the mucus so your sinuses can drain more easily.  Use a humidifier.  Inhale steam 3-4 times a day (for example, sit in the bathroom with the shower running).  Apply a warm, moist washcloth to your face 3-4 times a day, or as directed by your health care provider.  Use saline nasal sprays to help moisten and clean your sinuses.  Take medicines only as directed by your health care provider.  If you were prescribed either an antibiotic or antifungal medicine, finish it all even if you start to feel better. SEEK IMMEDIATE MEDICAL CARE IF:  You have increasing pain or severe headaches.  You have nausea, vomiting, or drowsiness.  You have swelling around your face.  You have vision problems.  You have a stiff neck.  You have difficulty breathing.   This information is not intended to replace  advice given to you by your health care provider. Make sure you discuss any questions you have with your health care provider.   Document Released: 12/24/2004 Document Revised: 01/14/2014 Document Reviewed: 01/08/2011 Elsevier Interactive Patient Education 2016 [place] for Gastroesophageal Reflux Disease, Adult When you have gastroesophageal reflux disease (GERD), the foods you eat and your eating habits are very important. Choosing the right foods can help ease the discomfort of GERD. WHAT GENERAL GUIDELINES DO I NEED TO FOLLOW?  Choose fruits, vegetables, whole grains, low-fat dairy products, and low-fat meat, fish, and poultry.  Limit fats such as oils, salad dressings, butter, nuts, and avocado.  Keep a food diary to identify foods that cause symptoms.  Avoid foods that cause reflux. These may be different for different people.  Eat frequent small meals instead of three large meals each  day.  Eat your meals slowly, in a relaxed setting.  Limit fried foods.  Cook foods using methods other than frying.  Avoid drinking alcohol.  Avoid drinking large amounts of liquids with your meals.  Avoid bending over or lying down until 2-3 hours after eating. WHAT FOODS ARE NOT RECOMMENDED? The following are some foods and drinks that may worsen your symptoms: Vegetables Tomatoes. Tomato juice. Tomato and spaghetti sauce. Chili peppers. Onion and garlic. Horseradish. Fruits Oranges, grapefruit, and lemon (fruit and juice). Meats High-fat meats, fish, and poultry. This includes hot dogs, ribs, ham, sausage, salami, and bacon. Dairy Whole milk and chocolate milk. Sour cream. Cream. Butter. Ice cream. Cream cheese.  Beverages Coffee and tea, with or without caffeine. Carbonated beverages or energy drinks. Condiments Hot sauce. Barbecue sauce.  Sweets/Desserts Chocolate and cocoa. Donuts. Peppermint and spearmint. Fats and Oils High-fat foods, including [place]  fries and potato chips. Other Vinegar. Strong spices, such as black pepper, white pepper, red pepper, cayenne, curry powder, cloves, ginger, and chili powder. The items listed above may not be a complete list of foods and beverages to avoid. Contact your dietitian for more information.   This information is not intended to replace advice given to you by your health care provider. Make sure you discuss any questions you have with your health care provider.   Document Released: 12/24/2004 Document Revised: 01/14/2014 Document Reviewed: 10/28/2012 Elsevier Interactive Patient Education [employer].

## 2015-06-13 ENCOUNTER — Other Ambulatory Visit: Payer: Self-pay

## 2015-06-13 DIAGNOSIS — F411 Generalized anxiety disorder: Secondary | ICD-10-CM

## 2015-06-13 LAB — URINE CULTURE: Colony Count: >=100000

## 2015-06-13 LAB — GC/CHLAMYDIA PROBE AMP
CT Probe RNA: NOT DETECTED
GC Probe RNA: NOT DETECTED

## 2015-06-13 NOTE — Telephone Encounter (Signed)
Angela Brooks, you just saw pt on 6/3 and discussed anxiety/grief. Pharm has sent a req for RF of alprazolam. I will pend for your review.

## 2015-06-14 MED ORDER — ALPRAZOLAM 0.5 MG PO TABS: 0.5000 mg | ORAL_TABLET | Freq: Every day | ORAL | Status: DC | PRN

## 2015-06-14 NOTE — Telephone Encounter (Signed)
Pt thanked Dexter for [place] it. She has not needed to take it very often, but with Mother's Day and then her Bday, she was having a more difficult time again with her mother's loss. I have faxed RF to pharm.

## 2015-06-14 NOTE — Telephone Encounter (Signed)
We never discussed refilling.  That prescription was from last year.  Looks like she has not been using a lot.  I will prescribe once daily as needed.  Please ask how often she uses this.

## 2015-10-17 ENCOUNTER — Other Ambulatory Visit: Payer: Self-pay

## 2016-01-03 ENCOUNTER — Other Ambulatory Visit: Payer: Self-pay | Admitting: Physician Assistant

## 2016-01-03 ENCOUNTER — Other Ambulatory Visit: Payer: Self-pay | Admitting: Family Medicine

## 2016-01-03 DIAGNOSIS — F411 Generalized anxiety disorder: Secondary | ICD-10-CM

## 2016-01-05 NOTE — Telephone Encounter (Signed)
Last OV 06/10/15, last RF for #30 was 6/7.

## 2016-01-09 NOTE — Telephone Encounter (Signed)
06/2015 last ov and refill

## 2016-01-09 NOTE — Telephone Encounter (Signed)
This is not our patient. Please see refill request for [facility] rx pool.

## 2016-01-10 NOTE — Telephone Encounter (Signed)
She will need to return for refill.  We have not seen her for 6 months and she was given 1 month supply

## 2016-01-11 NOTE — Telephone Encounter (Signed)
Pt advised, tx up front for appt.

## 2016-01-13 ENCOUNTER — Ambulatory Visit (INDEPENDENT_AMBULATORY_CARE_PROVIDER_SITE_OTHER): Payer: BLUE CROSS/BLUE SHIELD | Admitting: Family Medicine

## 2016-01-13 VITALS — BP 120/82 | HR 88 | Temp 98.0°F | Resp 16 | Ht 67.0 in | Wt 193.0 lb

## 2016-01-13 DIAGNOSIS — Z5181 Encounter for therapeutic drug level monitoring: Secondary | ICD-10-CM

## 2016-01-13 DIAGNOSIS — F4323 Adjustment disorder with mixed anxiety and depressed mood: Secondary | ICD-10-CM | POA: Diagnosis not present

## 2016-01-13 DIAGNOSIS — F411 Generalized anxiety disorder: Secondary | ICD-10-CM | POA: Diagnosis not present

## 2016-01-13 DIAGNOSIS — Z23 Encounter for immunization: Secondary | ICD-10-CM

## 2016-01-13 DIAGNOSIS — Z636 Dependent relative needing care at home: Secondary | ICD-10-CM

## 2016-01-13 DIAGNOSIS — G4700 Insomnia, unspecified: Secondary | ICD-10-CM

## 2016-01-13 DIAGNOSIS — Q682 Congenital deformity of knee: Secondary | ICD-10-CM

## 2016-01-13 DIAGNOSIS — F331 Major depressive disorder, recurrent, moderate: Secondary | ICD-10-CM

## 2016-01-13 MED ORDER — SERTRALINE HCL 50 MG PO TABS: 50.0000 mg | ORAL_TABLET | Freq: Every day | ORAL | 3 refills | Status: DC

## 2016-01-13 MED ORDER — HYDROCHLOROTHIAZIDE 25 MG PO TABS: ORAL_TABLET | ORAL | 0 refills | Status: DC

## 2016-01-13 MED ORDER — CYCLOBENZAPRINE HCL 10 MG PO TABS: 10.0000 mg | ORAL_TABLET | Freq: Every day | ORAL | 1 refills | Status: DC

## 2016-01-13 MED ORDER — HYDROCHLOROTHIAZIDE 25 MG PO TABS: ORAL_TABLET | ORAL | 3 refills | Status: DC

## 2016-01-13 MED ORDER — ALPRAZOLAM 0.5 MG PO TABS: 0.5000 mg | ORAL_TABLET | Freq: Three times a day (TID) | ORAL | 2 refills | Status: DC | PRN

## 2016-01-13 MED ORDER — MELOXICAM 15 MG PO TABS: 15.0000 mg | ORAL_TABLET | Freq: Every day | ORAL | 1 refills | Status: DC

## 2016-01-13 NOTE — Progress Notes (Signed)
Subjective:  By signing my name below, I, Angela Brooks, attest that this documentation has been prepared under the direction and in the presence of Delman Cheadle, MD Electronically Signed: Ladene Artist, ED Scribe 01/13/2016 at 9:40 AM.   Patient ID: Angela Brooks, female    DOB: 1970-08-14, 46 y.o.   MRN: [id number]  Chief Complaint  Patient presents with  . Medication Refill    xanax, flexeril, hydrochlorothiazide  . Cough    would like refill of tussionex for cough  . Flu Vaccine    Pended  . Depression    see screening   HPI HPI Comments: Angela Brooks is a 46 y.o. female, with a h/o HTN, depression and anxiety, who presents to the [facility] for a medication refill of Xanax. Pt states that she is currently under a lot stress with her 13 y.o daughter who is currently [redacted] weeks pregnant and has a h/o bipolar and schizophrenia. Her daughter also threatened suicide 2 weeks ago. Pt states that she is also experiencing some depression around the holidays since her mother passed last year and she was started on Xanax 3 times/daily when her mom passed last year which she states helped but was almost like a band aid. Pt states that she winged herself off of the medication and has never taken medication for anxiety or depression prior. Pt does not see a therapist; states that she does not have time since she works 2 jobs, however she speaks with her daughter's therapist who comes to their home. Her daughter also sees a Teacher, music. Pt's mother also has a h/o bipolar disorder.   Insomina Pt reports insomnia for a while. She states that she goes to bed around 10:45 PM but wakes up around 2:30 AM. She states that her mind is constantly racing but sometimes her mind isn't on anything when she wakes up but she just can't sleep. Pt reports occasional panic attacks which self-resolve with sitting downstairs and "resting her mind".   HTN Pt states that she checks her BP outside the  office and it has been fine. Triage BP: 120/82. Pt has a lipid panel 2 years ago that was normal.   Knee Pain Pt had a right knee XR 2 years ago that showed a patella alta. She states that she is now having worsening left knee pain which she suspects is from favoring the knee. She has taken flexeril for the pain which she recently ran out of and is requesting a refill of. Pt also takes 2-3 ibuprofen with mild relief.   Depression screen [facility] 2/9 01/13/2016 01/13/2016 06/10/2015 09/28/2014 06/21/2014  Decreased Interest 2 0 2 0 0  Down, Depressed, Hopeless 1 0 2 0 0  PHQ - 2 Score 3 0 4 0 0  Altered sleeping 2 - 0 - -  Tired, decreased energy 2 - 2 - -  Change in appetite 2 - 0 - -  Feeling bad or failure about yourself  1 - 0 - -  Trouble concentrating 1 - 0 - -  Moving slowly or fidgety/restless 0 - 0 - -  Suicidal thoughts 0 - 0 - -  PHQ-9 Score 11 - 6 - -  Difficult doing work/chores Somewhat difficult - Somewhat difficult - -   Past Medical History:  Diagnosis Date  . Allergy   . Anxiety   . Depression   . GERD (gastroesophageal reflux disease)   . Hypertension    Current Outpatient Prescriptions on File  Prior to Visit  Medication Sig Dispense Refill  . ALPRAZolam (XANAX) 0.5 MG tablet Take 1 tablet (0.5 mg total) by mouth daily as needed for anxiety. 30 tablet 0  . hydrochlorothiazide (HYDRODIURIL) 25 MG tablet TAKE ONE TABLET BY MOUTH ONCE DAILY....PATIENT NEEDS OFFICE VISIT FOR ADDITIONAL REFILLS 30 tablet 0  . omeprazole (PRILOSEC) 20 MG capsule Take 1 capsule (20 mg total) by mouth daily. 30 capsule 3  . cetirizine (ZYRTEC) 10 MG tablet Take 1 tablet (10 mg total) by mouth daily. 30 tablet 5  . chlorpheniramine-HYDROcodone (TUSSIONEX PENNKINETIC ER) 10-8 MG/5ML SUER Take 5 mLs by mouth at bedtime as needed. (Patient not taking: Reported on 01/13/2016) 60 mL 0  . cyclobenzaprine (FLEXERIL) 10 MG tablet Take 1 tablet (10 mg total) by mouth at bedtime. Prn for knee pain/spasms (Patient  not taking: Reported on 01/13/2016) 30 tablet 1  . ranitidine (ZANTAC) 150 MG tablet Take 1 tablet (150 mg total) by mouth 2 (two) times daily. 60 tablet 0   No current facility-administered medications on file prior to visit.    No Known Allergies   Review of Systems  Constitutional: Positive for fatigue. Negative for activity change, appetite change, chills and fever.  Gastrointestinal: Negative for abdominal pain and vomiting.  Musculoskeletal: Positive for arthralgias and gait problem. Negative for joint swelling.  Skin: Negative for color change and wound.  Neurological: Negative for numbness.  Psychiatric/Behavioral: Positive for agitation, behavioral problems, decreased concentration, dysphoric mood and sleep disturbance. Negative for confusion. The patient is nervous/anxious. The patient is not hyperactive.   see hpi    Objective:   Physical Exam  Constitutional: She is oriented to person, place, and time. She appears well-developed and well-nourished. No distress.  HENT:  Head: Normocephalic and atraumatic.  Eyes: Conjunctivae and EOM are normal.  Neck: Neck supple. No tracheal deviation present.  Cardiovascular: Normal rate, regular rhythm and normal heart sounds.   Pulmonary/Chest: Effort normal and breath sounds normal. No respiratory distress.  Musculoskeletal: Normal range of motion. She exhibits no edema.  Neurological: She is alert and oriented to person, place, and time.  Skin: Skin is warm and dry.  Psychiatric: She has a normal mood and affect. Her behavior is normal.  Nursing note and vitals reviewed.  BP 120/82   Pulse 88   Temp 98 F (36.7 C) (Oral)   Resp 16   Ht 5\' 7"  (1.702 m)   Wt 193 lb (87.5 kg)   SpO2 99%   BMI 30.23 kg/m     Assessment & Plan:  Advised pt needs to establish PCP for controlled med refills of xanax. Reviewed controlled sub protocol - no contract signed yet.  1. Need for prophylactic vaccination and inoculation against influenza     2. Adjustment disorder with mixed anxiety and depressed mood   3. Insomnia, unspecified type   4. Moderate episode of recurrent major depressive disorder ([place])   5. Anxiety state   6. Caregiver burden   7. Patella alta   8. Medication monitoring encounter     Orders Placed This Encounter  Procedures  . Comprehensive metabolic panel    Meds ordered this encounter  Medications  . ALPRAZolam (XANAX) 0.5 MG tablet    Sig: Take 1 tablet (0.5 mg total) by mouth 3 (three) times daily as needed for anxiety.    Dispense:  30 tablet    Refill:  2  . DISCONTD: hydrochlorothiazide (HYDRODIURIL) 25 MG tablet    Sig: TAKE ONE TABLET BY  MOUTH ONCE DAILY    Dispense:  30 tablet    Refill:  0  . cyclobenzaprine (FLEXERIL) 10 MG tablet    Sig: Take 1 tablet (10 mg total) by mouth at bedtime. Prn for knee pain/spasms    Dispense:  30 tablet    Refill:  1  . meloxicam (MOBIC) 15 MG tablet    Sig: Take 1 tablet (15 mg total) by mouth daily.    Dispense:  30 tablet    Refill:  1  . sertraline (ZOLOFT) 50 MG tablet    Sig: Take 1 tablet (50 mg total) by mouth daily.    Dispense:  30 tablet    Refill:  3  . hydrochlorothiazide (HYDRODIURIL) 25 MG tablet    Sig: TAKE ONE TABLET BY MOUTH ONCE DAILY    Dispense:  90 tablet    Refill:  3    I personally performed the services described in this documentation, which was scribed in my presence. The recorded information has been reviewed and considered, and addended by me as needed.   Delman Cheadle, M.D.  [facility] [address] [place], [place] [address] [phone] phone [phone] fax  02/04/16 3:41 AM

## 2016-01-13 NOTE — Patient Instructions (Addendum)
Do not use meloxicam with any other otc pain medication other than tylenol/acetaminophen - so no aleve, ibuprofen, motrin, advil, etc.     IF you received an x-ray today, you will receive an invoice from [facility]. Please contact [facility] at [phone] with questions or concerns regarding your invoice.   IF you received labwork today, you will receive an invoice from [place]. Please contact [facility] at [phone] with questions or concerns regarding your invoice.   Our billing staff will not be able to assist you with questions regarding bills from these companies.  You will be contacted with the lab results as soon as they are available. The fastest way to get your results is to activate your My Chart account. Instructions are located on the last page of this paperwork. If you have not heard from [place] regarding the results in 2 weeks, please contact this office.     Stress and Stress Management Stress is a normal reaction to life events. It is what you feel when life demands more than you are used to or more than you can handle. Some stress can be useful. For example, the stress reaction can help you catch the last bus of the day, study for a test, or meet a deadline at work. But stress that occurs too often or for too long can cause problems. It can affect your emotional health and interfere with relationships and normal daily activities. Too much stress can weaken your immune system and increase your risk for physical illness. If you already have a medical problem, stress can make it worse. What are the causes? All sorts of life events may cause stress. An event that causes stress for one person may not be stressful for another person. Major life events commonly cause stress. These may be positive or negative. Examples include losing your job, moving into a new home, getting married, having a baby, or losing a loved one. Less obvious life events may also cause stress,  especially if they occur day after day or in combination. Examples include working long hours, driving in traffic, caring for children, being in debt, or being in a difficult relationship. What are the signs or symptoms? Stress may cause emotional symptoms including, the following:  Anxiety. This is feeling worried, afraid, on edge, overwhelmed, or out of control.  Anger. This is feeling irritated or impatient.  Depression. This is feeling sad, down, helpless, or guilty.  Difficulty focusing, remembering, or making decisions. Stress may cause physical symptoms, including the following:  Aches and pains. These may affect your head, neck, back, stomach, or other areas of your body.  Tight muscles or clenched jaw.  Low energy or trouble sleeping. Stress may cause unhealthy behaviors, including the following:  Eating to feel better (overeating) or skipping meals.  Sleeping too little, too much, or both.  Working too much or putting off tasks (procrastination).  Smoking, drinking alcohol, or using drugs to feel better. How is this diagnosed? Stress is diagnosed through an assessment by your health care provider. Your health care provider will ask questions about your symptoms and any stressful life events.Your health care provider will also ask about your medical history and may order blood tests or other tests. Certain medical conditions and medicine can cause physical symptoms similar to stress. Mental illness can cause emotional symptoms and unhealthy behaviors similar to stress. Your health care provider may refer you to a mental health professional for further evaluation. How is this treated? Stress management is  the recommended treatment for stress.The goals of stress management are reducing stressful life events and coping with stress in healthy ways. Techniques for reducing stressful life events include the following:  Stress identification. Self-monitor for stress and identify  what causes stress for you. These skills may help you to avoid some stressful events.  Time management. Set your priorities, keep a calendar of events, and learn to say "no." These tools can help you avoid making too many commitments. Techniques for coping with stress include the following:  Rethinking the problem. Try to think realistically about stressful events rather than ignoring them or overreacting. Try to find the positives in a stressful situation rather than focusing on the negatives.  Exercise. Physical exercise can release both physical and emotional tension. The key is to find a form of exercise you enjoy and do it regularly.  Relaxation techniques. These relax the body and mind. Examples include yoga, meditation, tai chi, biofeedback, deep breathing, progressive muscle relaxation, listening to music, being out in nature, journaling, and other hobbies. Again, the key is to find one or more that you enjoy and can do regularly.  Healthy lifestyle. Eat a balanced diet, get plenty of sleep, and do not smoke. Avoid using alcohol or drugs to relax.  Strong support network. Spend time with family, friends, or other people you enjoy being around.Express your feelings and talk things over with someone you trust. Counseling or talktherapy with a mental health professional may be helpful if you are having difficulty managing stress on your own. Medicine is typically not recommended for the treatment of stress.Talk to your health care provider if you think you need medicine for symptoms of stress. Follow these instructions at home:  Keep all follow-up visits as directed by your health care provider.  Take all medicines as directed by your health care provider. Contact a health care provider if:  Your symptoms get worse or you start having new symptoms.  You feel overwhelmed by your problems and can no longer manage them on your own. Get help right away if:  You feel like hurting  yourself or someone else. This information is not intended to replace advice given to you by your health care provider. Make sure you discuss any questions you have with your health care provider. Document Released: 06/19/2000 Document Revised: 06/01/2015 Document Reviewed: 08/18/2012 Elsevier Interactive Patient Education  2017 [place].  Major Depressive Disorder, Adult Major depressive disorder (MDD) is a mental health condition. It may also be called clinical depression or unipolar depression. MDD usually causes feelings of sadness, hopelessness, or helplessness. MDD can also cause physical symptoms. It can interfere with work, school, relationships, and other everyday activities. MDD may be mild, moderate, or severe. It may occur once (single episode major depressive disorder) or it may occur multiple times (recurrent major depressive disorder). What are the causes? The exact cause of this condition is not known. MDD is most likely caused by a combination of things, which may include:  Genetic factors. These are traits that are passed along from parent to child.  Individual factors. Your personality, your behavior, and the way you handle your thoughts and feelings may contribute to MDD. This includes personality traits and behaviors learned from others.  Physical factors, such as:  Differences in the part of your brain that controls emotion. This part of your brain may be different than it is in people who do not have MDD.  Long-term (chronic) medical or psychiatric illnesses.  Social factors. Traumatic  experiences or major life changes may play a role in the development of MDD. What increases the risk? This condition is more likely to develop in women. The following factors may also make you more likely to develop MDD:  A family history of depression.  Troubled family relationships.  Abnormally low levels of certain brain chemicals.  Traumatic events in childhood, especially  abuse or the loss of a parent.  Being under a lot of stress, or long-term stress, especially from upsetting life experiences or losses.  A history of:  Chronic physical illness.  Other mental health disorders.  Substance abuse.  Poor living conditions.  Experiencing social exclusion or discrimination on a regular basis. What are the signs or symptoms? The main symptoms of MDD typically include:  Constant depressed or irritable mood.  Loss of interest in things and activities. MDD symptoms may also include:  Sleeping or eating too much or too little.  Unexplained weight change.  Fatigue or low energy.  Feelings of worthlessness or guilt.  Difficulty thinking clearly or making decisions.  Thoughts of suicide or of harming others.  Physical agitation or weakness.  Isolation. Severe cases of MDD may also occur with other symptoms, such as:  Delusions or hallucinations, in which you imagine things that are not real (psychotic depression).  Low-level depression that lasts at least a year (chronic depression or persistent depressive disorder).  Extreme sadness and hopelessness (melancholic depression).  Trouble speaking and moving (catatonic depression). How is this diagnosed? This condition may be diagnosed based on:  Your symptoms.  Your medical history, including your mental health history. This may involve tests to evaluate your mental health. You may be asked questions about your lifestyle, including any drug and alcohol use, and how long you have had symptoms of MDD.  A physical exam.  Blood tests to rule out other conditions. You must have a depressed mood and at least four other MDD symptoms most of the day, nearly every day in the same 2-week timeframe before your health care provider can confirm a diagnosis of MDD. How is this treated? This condition is usually treated by mental health professionals, such as psychologists, psychiatrists, and clinical  social workers. You may need more than one type of treatment. Treatment may include:  Psychotherapy. This is also called talk therapy or counseling. Types of psychotherapy include:  Cognitive behavioral therapy (CBT). This type of therapy teaches you to recognize unhealthy feelings, thoughts, and behaviors, and replace them with positive thoughts and actions.  Interpersonal therapy (IPT). This helps you to improve the way you relate to and communicate with others.  Family therapy. This treatment includes members of your family.  Medicine to treat anxiety and depression, or to help you control certain emotions and behaviors.  Lifestyle changes, such as:  Limiting alcohol and drug use.  Exercising regularly.  Getting plenty of sleep.  Making healthy eating choices.  Spending more time outdoors. Treatments involving stimulation of the brain can be used in situations with extremely severe symptoms, or when medicine or other therapies do not work over time. These treatments include electroconvulsive therapy, transcranial magnetic stimulation, and vagal nerve stimulation. Follow these instructions at home: Activity  Return to your normal activities as told by your health care provider.  Exercise regularly and spend time outdoors as told by your health care provider. General instructions  Take over-the-counter and prescription medicines only as told by your health care provider.  Do not drink alcohol. If you drink alcohol, limit your  alcohol intake to no more than 1 drink a day for nonpregnant women and 2 drinks a day for men. One drink equals 12 oz of beer, 5 oz of wine, or 1 oz of hard liquor. Alcohol can affect any antidepressant medicines you are taking. Talk to your health care provider about your alcohol use.  Eat a healthy diet and get plenty of sleep.  Find activities that you enjoy doing, and make time to do them.  Consider joining a support group. Your health care provider  may be able to recommend a support group.  Keep all follow-up visits as told by your health care provider. This is important. Where to find more information: [employer] on Mental Illness  www.nami.org U.S. National Institute of Mental Health  https://carter.com/ National Suicide Prevention Lifeline  1-800-273-TALK 737-553-6009). This is free, 24-hour help. Contact a health care provider if:  Your symptoms get worse.  You develop new symptoms. Get help right away if:  You self-harm.  You have serious thoughts about hurting yourself or others.  You see, hear, taste, smell, or feel things that are not present (hallucinate). This information is not intended to replace advice given to you by your health care provider. Make sure you discuss any questions you have with your health care provider. Document Released: 04/20/2012 Document Revised: 08/31/2015 Document Reviewed: 07/05/2015 Elsevier Interactive Patient Education  2017 [employer].

## 2016-01-14 LAB — COMPREHENSIVE METABOLIC PANEL
ALT: 16 IU/L (ref 0–32)
AST: 15 IU/L (ref 0–40)
Albumin/Globulin Ratio: 1.3 (ref 1.2–2.2)
Albumin: 4.2 g/dL (ref 3.5–5.5)
Alkaline Phosphatase: 68 IU/L (ref 39–117)
BUN/Creatinine Ratio: 15 (ref 9–23)
BUN: 14 mg/dL (ref 6–24)
Bilirubin Total: 0.5 mg/dL (ref 0.0–1.2)
CO2: 25 mmol/L (ref 18–29)
Calcium: 9.3 mg/dL (ref 8.7–10.2)
Chloride: 100 mmol/L (ref 96–106)
Creatinine, Ser: 0.95 mg/dL (ref 0.57–1.00)
GFR calc Af Amer: 83 mL/min/{1.73_m2} (ref >59)
GFR calc non Af Amer: 72 mL/min/{1.73_m2} (ref >59)
Globulin, Total: 3.3 g/dL (ref 1.5–4.5)
Glucose: 101 mg/dL — ABNORMAL HIGH (ref 65–99)
Potassium: 4.6 mmol/L (ref 3.5–5.2)
Sodium: 140 mmol/L (ref 134–144)
Total Protein: 7.5 g/dL (ref 6.0–8.5)

## 2016-02-08 ENCOUNTER — Telehealth: Payer: Self-pay | Admitting: Family Medicine

## 2016-02-08 NOTE — Telephone Encounter (Signed)
Pt needs to come in to be seen.  She can only use controlled substances as prescribed and I will not be maintaining her on that high of a dose.  She needs to go back to 1 tab tid and then come in to talk about what other antidepressant we can try in effort to decrease the xanax so she does not need it so often.  If she thinks she is going to require such a high dose of such an addictive medicine, than I would highly recommend she establish with a psychiatrist as they have more experiencing managing risky meds like this.   No psychological or psychiatric services take physician referrals - they always want the patient to call. Some excellent private psychiatrists for individual counseling are:  [facility]  [address], [place], [place] [address]  Phone:([phone]) [facility] [address] [place], [place] [address]  Phone:([phone]) [facility] [address] [place], [place] Phone: [place]  [address] [place], [place] [address]  [phone]   [facility], [place], [place] [address] [phone]  [facility] [address] [place], [place], [place] [address].  [phone]

## 2016-02-08 NOTE — Telephone Encounter (Signed)
Pt calling to let us know that she stop taking the Zoloft because it made her feel like she was in a trance and her speech slow she has been off of it for 3 days now does not want to take it anymore BUT she has been taking the Xanax not everyday but somedays taking two tabs PO TID instead of one tab PO TID she's going to need a new RX filled out differently and she has a few days of Xanax left she states that she can function better that way

## 2016-02-09 NOTE — Telephone Encounter (Signed)
Pt states she does not want zoloft, it makes her feel out of it. Lots going on in life. tx up front for appt tomorrow

## 2016-02-10 ENCOUNTER — Ambulatory Visit (INDEPENDENT_AMBULATORY_CARE_PROVIDER_SITE_OTHER): Payer: BLUE CROSS/BLUE SHIELD | Admitting: Family Medicine

## 2016-02-10 VITALS — BP 120/80 | HR 75 | Temp 98.6°F | Resp 20 | Ht 67.0 in | Wt 193.4 lb

## 2016-02-10 DIAGNOSIS — F411 Generalized anxiety disorder: Secondary | ICD-10-CM

## 2016-02-10 DIAGNOSIS — Z23 Encounter for immunization: Secondary | ICD-10-CM | POA: Diagnosis not present

## 2016-02-10 MED ORDER — ALPRAZOLAM 0.5 MG PO TABS: 0.5000 mg | ORAL_TABLET | Freq: Three times a day (TID) | ORAL | 2 refills | Status: DC | PRN
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
## 2016-02-10 NOTE — Patient Instructions (Signed)
     IF you received an x-ray today, you will receive an invoice from [facility]. Please contact [facility] at [phone] with questions or concerns regarding your invoice.   IF you received labwork today, you will receive an invoice from [facility]. Please contact [facility] at [phone] with questions or concerns regarding your invoice.   Our billing staff will not be able to assist you with questions regarding bills from these companies.  You will be contacted with the lab results as soon as they are available. The fastest way to get your results is to activate your My Chart account. Instructions are located on the last page of this paperwork. If you have not heard from us regarding the results in 2 weeks, please contact this office.     

## 2016-02-10 NOTE — Progress Notes (Signed)
     IF you received an x-ray today, you will receive an invoice from [facility]. Please contact [facility] at [phone] with questions or concerns regarding your invoice.   IF you received labwork today, you will receive an invoice from [facility]. Please contact [facility] at [phone] with questions or concerns regarding your invoice.   Our billing staff will not be able to assist you with questions regarding bills from these companies.  You will be contacted with the lab results as soon as they are available. The fastest way to get your results is to activate your My Chart account. Instructions are located on the last page of this paperwork. If you have not heard from us regarding the results in 2 weeks, please contact this office.     

## 2016-03-12 ENCOUNTER — Other Ambulatory Visit: Payer: Self-pay | Admitting: Family Medicine

## 2016-05-27 ENCOUNTER — Other Ambulatory Visit: Payer: Self-pay | Admitting: Family Medicine

## 2016-05-27 DIAGNOSIS — F411 Generalized anxiety disorder: Secondary | ICD-10-CM

## 2017-04-14 ENCOUNTER — Emergency Department (HOSPITAL_COMMUNITY)
Admission: EM | Admit: 2017-04-14 | Discharge: 2017-04-14 | Disposition: A | Discharge status: Home / Self Care | Payer: BLUE CROSS/BLUE SHIELD | Attending: Emergency Medicine | Admitting: Emergency Medicine

## 2017-04-14 ENCOUNTER — Other Ambulatory Visit: Payer: Self-pay

## 2017-04-14 ENCOUNTER — Emergency Department (HOSPITAL_COMMUNITY): Payer: BLUE CROSS/BLUE SHIELD

## 2017-04-14 ENCOUNTER — Encounter (HOSPITAL_COMMUNITY): Payer: Self-pay

## 2017-04-14 DIAGNOSIS — R4702 Dysphasia: Secondary | ICD-10-CM | POA: Diagnosis not present

## 2017-04-14 DIAGNOSIS — I10 Essential (primary) hypertension: Secondary | ICD-10-CM | POA: Diagnosis not present

## 2017-04-14 DIAGNOSIS — R197 Diarrhea, unspecified: Secondary | ICD-10-CM | POA: Insufficient documentation

## 2017-04-14 DIAGNOSIS — R42 Dizziness and giddiness: Secondary | ICD-10-CM | POA: Insufficient documentation

## 2017-04-14 DIAGNOSIS — R1110 Vomiting, unspecified: Secondary | ICD-10-CM | POA: Diagnosis not present

## 2017-04-14 DIAGNOSIS — R531 Weakness: Secondary | ICD-10-CM | POA: Insufficient documentation

## 2017-04-14 DIAGNOSIS — R202 Paresthesia of skin: Secondary | ICD-10-CM | POA: Diagnosis present

## 2017-04-14 LAB — COMPREHENSIVE METABOLIC PANEL
ALT: 20 U/L (ref 14–54)
AST: 21 U/L (ref 15–41)
Albumin: 3.8 g/dL (ref 3.5–5.0)
Alkaline Phosphatase: 65 U/L (ref 38–126)
Anion gap: 10 (ref 5–15)
BUN: 13 mg/dL (ref 6–20)
CO2: 22 mmol/L (ref 22–32)
Calcium: 8.8 mg/dL — ABNORMAL LOW (ref 8.9–10.3)
Chloride: 104 mmol/L (ref 101–111)
Creatinine, Ser: 0.86 mg/dL (ref 0.44–1.00)
GFR calc Af Amer: >60 mL/min (ref >60)
GFR calc non Af Amer: >60 mL/min (ref >60)
Glucose, Bld: 100 mg/dL — ABNORMAL HIGH (ref 65–99)
Potassium: 3.5 mmol/L (ref 3.5–5.1)
Sodium: 136 mmol/L (ref 135–145)
Total Bilirubin: 0.9 mg/dL (ref 0.3–1.2)
Total Protein: 7.4 g/dL (ref 6.5–8.1)

## 2017-04-14 LAB — PROTIME-INR
INR: 0.96
Prothrombin Time: 12.7 seconds (ref 11.4–15.2)

## 2017-04-14 LAB — DIFFERENTIAL
Basophils Absolute: 0 10*3/uL (ref 0.0–0.1)
Basophils Relative: 0 %
Eosinophils Absolute: 0.1 10*3/uL (ref 0.0–0.7)
Eosinophils Relative: 1 %
Lymphocytes Relative: 13 %
Lymphs Abs: 1 10*3/uL (ref 0.7–4.0)
Monocytes Absolute: 0.3 10*3/uL (ref 0.1–1.0)
Monocytes Relative: 4 %
Neutro Abs: 6.6 10*3/uL (ref 1.7–7.7)
Neutrophils Relative %: 82 %

## 2017-04-14 LAB — I-STAT TROPONIN, ED: Troponin i, poc: 0 ng/mL (ref 0.00–0.08)

## 2017-04-14 LAB — I-STAT CHEM 8, ED
BUN: 13 mg/dL (ref 6–20)
Calcium, Ion: 1.09 mmol/L — ABNORMAL LOW (ref 1.15–1.40)
Chloride: 103 mmol/L (ref 101–111)
Creatinine, Ser: 0.8 mg/dL (ref 0.44–1.00)
Glucose, Bld: 101 mg/dL — ABNORMAL HIGH (ref 65–99)
HCT: 32 % — ABNORMAL LOW (ref 36.0–46.0)
Hemoglobin: 10.9 g/dL — ABNORMAL LOW (ref 12.0–15.0)
Potassium: 3.5 mmol/L (ref 3.5–5.1)
Sodium: 137 mmol/L (ref 135–145)
TCO2: 22 mmol/L (ref 22–32)

## 2017-04-14 LAB — CBC
HCT: 32.2 % — ABNORMAL LOW (ref 36.0–46.0)
Hemoglobin: 9.5 g/dL — ABNORMAL LOW (ref 12.0–15.0)
MCH: 20.6 pg — ABNORMAL LOW (ref 26.0–34.0)
MCHC: 29.5 g/dL — ABNORMAL LOW (ref 30.0–36.0)
MCV: 69.8 fL — ABNORMAL LOW (ref 78.0–100.0)
Platelets: 283 10*3/uL (ref 150–400)
RBC: 4.61 MIL/uL (ref 3.87–5.11)
RDW: 15.3 % (ref 11.5–15.5)
WBC: 8 10*3/uL (ref 4.0–10.5)

## 2017-04-14 LAB — I-STAT BETA HCG BLOOD, ED (MC, WL, AP ONLY): I-stat hCG, quantitative: <5 m[IU]/mL (ref <5)

## 2017-04-14 LAB — CBG MONITORING, ED: Glucose-Capillary: 92 mg/dL (ref 65–99)

## 2017-04-14 LAB — APTT: aPTT: 25 seconds (ref 24–36)

## 2017-04-14 MED ORDER — ONDANSETRON HCL 4 MG/2ML IJ SOLN
4.0000 mg | Freq: Once | INTRAMUSCULAR | Status: AC
  Administered 2017-04-14: 4 mg via INTRAVENOUS
  Filled 2017-04-14: qty 2

## 2017-04-14 MED ORDER — SODIUM CHLORIDE 0.9 % IV BOLUS
1000.0000 mL | Freq: Once | INTRAVENOUS | Status: AC
  Administered 2017-04-14: 1000 mL via INTRAVENOUS

## 2017-04-14 NOTE — ED Notes (Signed)
Received call from [facility], states that patient has been vomiting several times and really needs a room. Spoke with Charge - stated move to room when available. Pt to [id number] until room available.

## 2017-04-14 NOTE — ED Triage Notes (Signed)
Patient c/o numbness on left side, and now feels like she is having problems swallowing and that she is gasping for air.

## 2017-04-14 NOTE — ED Provider Notes (Signed)
[facility] Provider Note   CSN: [id number] Arrival date & time: 04/14/17  0121     History   Chief Complaint Chief Complaint  Patient presents with  . Numbness    HPI Angela Brooks is a 47 y.o. female.  The history is provided by the patient.  Emesis   This is a new problem. The problem has been gradually worsening. There has been no fever. Associated symptoms include diarrhea. Pertinent negatives include no abdominal pain, no cough and no fever.  Presents with multiple complaints.  When she initially arrived she told the nurse that she is having numbness on her left side.  She also reported problems swallowing.  When I interviewed the patient she reports is been having vomiting diarrhea for the past 24 hours.  She reports that when she breathes she feels like there is no enough air getting through her throat.  She denies any speech difficulty.  No focal weakness. For the numbness, she reports this is been going on for weeks.  She reports at times when she is working and does heavy lifting, she will have some numbness in her left leg. She is able to ambulate Reports dizziness for the past several days.  She reports that she feels that the room is spinning.  No syncope.  When asked patient what was the main issue to address tonight she reports it is her dizziness  Past Medical History:  Diagnosis Date  . Allergy   . Anxiety   . Depression   . GERD (gastroesophageal reflux disease)   . Hypertension    Stopped taking meds x 1 year ago    Patient Active Problem List   Diagnosis Date Noted  . Grief 09/28/2014  . Essential hypertension 09/28/2014  . Dental caries extending into pulp 02/06/2014  . GERD (gastroesophageal reflux disease) 05/14/2011    Past Surgical History:  Procedure Laterality Date  . CHOLECYSTECTOMY       OB History    Gravida  1   Para      Term      Preterm      AB  1   Living  1     SAB  1   TAB     Ectopic      Multiple      Live Births               Home Medications    Prior to Admission medications   Not on File    Family History Family History  Problem Relation Age of Onset  . Hypertension Mother   . Diabetes Maternal Grandmother     Social History Social History   Tobacco Use  . Smoking status: Never Smoker  . Smokeless tobacco: Never Used  Substance Use Topics  . Alcohol use: No    Alcohol/week: 0.0 oz  . Drug use: No     Allergies   Patient has no known allergies.   Review of Systems Review of Systems  Constitutional: Negative for fever.  HENT: Negative for hearing loss.   Eyes:       Mild blurred vision  Respiratory: Negative for cough.   Cardiovascular: Negative for chest pain.  Gastrointestinal: Positive for diarrhea and vomiting. Negative for abdominal pain.  Genitourinary:       Heavy periods, no active vaginal bleeding at this time  Musculoskeletal: Negative for back pain.  Neurological: Positive for dizziness and numbness. Negative for syncope and speech difficulty.  All other systems reviewed and are negative.    Physical Exam Updated Vital Signs BP (!) 148/93   Pulse 67   Temp 98.8 F (37.1 C) (Oral)   Resp 17   Ht 1.651 m (5\' 5" )   Wt 88.9 kg (196 lb)   LMP 03/25/2017 (Approximate)   SpO2 99%   BMI 32.62 kg/m   Physical Exam CONSTITUTIONAL: Well developed/well nourished HEAD: Normocephalic/atraumatic EYES: EOMI/PERRL, no nystagmus,no ptosis ENMT: Mucous membranes moist, uvula midline, no stridor, normal phonation NECK: supple no meningeal signs, no bruits CV: S1/S2 noted, no murmurs/rubs/gallops noted LUNGS: Lungs are clear to auscultation bilaterally, no apparent distress ABDOMEN: soft, nontender, no rebound or guarding GU:no cva tenderness NEURO:Awake/alert, face symmetric, no arm or leg drift is noted Equal 5/5 strength with shoulder abduction, elbow flex/extension, wrist flex/extension in upper  extremities  Equal 5/5 strength with hip flexion,knee flex/extension, foot dorsi/plantar flexion Cranial nerves 3/4/5/6/07/15/08/11/12 tested and intact Gait normal without ataxia No past pointing No dysarthria Sensation to light touch intact in all extremities EXTREMITIES: pulses normal, full ROM SKIN: warm, color normal PSYCH: no abnormalities of mood noted   ED Treatments / Results  Labs (all labs ordered are listed, but only abnormal results are displayed) Labs Reviewed  CBC - Abnormal; Notable for the following components:      Result Value   Hemoglobin 9.5 (*)    HCT 32.2 (*)    MCV 69.8 (*)    MCH 20.6 (*)    MCHC 29.5 (*)    All other components within normal limits  COMPREHENSIVE METABOLIC PANEL - Abnormal; Notable for the following components:   Glucose, Bld 100 (*)    Calcium 8.8 (*)    All other components within normal limits  I-STAT CHEM 8, ED - Abnormal; Notable for the following components:   Glucose, Bld 101 (*)    Calcium, Ion 1.09 (*)    Hemoglobin 10.9 (*)    HCT 32.0 (*)    All other components within normal limits  PROTIME-INR  APTT  DIFFERENTIAL  I-STAT TROPONIN, ED  CBG MONITORING, ED  I-STAT BETA HCG BLOOD, ED ([facility], [facility], [facility] ONLY)    EKG EKG Interpretation  Date/Time:  Monday April 14 2017 01:41:11 EDT Ventricular Rate:  79 PR Interval:  152 QRS Duration: 82 QT Interval:  382 QTC Calculation: 438 R Axis:   32 Text Interpretation:  Normal sinus rhythm Normal ECG No significant change since last tracing Confirmed by Ripley Fraise [phone]) on 04/14/2017 2:43:22 AM   Radiology Ct Head Wo Contrast  Result Date: 04/14/2017 CLINICAL DATA:  47 y/o  F; left-sided weakness. EXAM: CT HEAD WITHOUT CONTRAST TECHNIQUE: Contiguous axial images were obtained from the base of the skull through the vertex without intravenous contrast. COMPARISON:  None. FINDINGS: Brain: No evidence of acute infarction, hemorrhage, hydrocephalus, extra-axial collection or  mass lesion/mass effect. Vascular: No hyperdense vessel or unexpected calcification. Skull: Normal. Negative for fracture or focal lesion. Sinuses/Orbits: No acute finding. Other: None. IMPRESSION: Negative CT of the head. Electronically Signed   By: Kristine Garbe M.D.   On: 04/14/2017 02:39    Procedures Procedures   Medications Ordered in ED Medications  sodium chloride 0.9 % bolus 1,000 mL (1,000 mLs Intravenous New Bag/Given 04/14/17 0311)  ondansetron (ZOFRAN) injection 4 mg (4 mg Intravenous Given 04/14/17 0311)     Initial Impression / Assessment and Plan / ED Course  I have reviewed the triage vital signs and the nursing notes.  Pertinent  labs  results that were available during my care of the patient were reviewed by me and considered in my medical decision making (see chart for details).     3:38 AM Patient presents for multiple complaints.  She was initially worked up via triage as a stroke patient.  However on my evaluation she is most concerned with the vomiting diarrhea and dizziness.  No focal neuro deficit on my exam, she is ambulatory without difficulty She reports she is mostly concerned that she has numbness at times in her leg when she is working and lifting heavy objects.  My suspicion for TIA/CVA is low She does feel dehydrated from the vomiting and diarrhea, will give IV fluids.  She also reports that she is not taking any medicines for her blood pressure at home Dizziness could be a combination of elevated blood pressure that is untreated as well as mild anemia that she gets likely due to heavy atrial cycle 5:10 AM Patient improved after IV fluids and requesting discharge home.  She tells me she is taking no medications at this time.  I advised her to follow with her PCP in urgent medical for recheck. Final Clinical Impressions(s) / ED Diagnoses   Final diagnoses:  Paresthesia  Vomiting and diarrhea    ED Discharge Orders    None       Ripley Fraise, MD 04/14/17 [phone]

## 2017-04-14 NOTE — ED Notes (Signed)
ED Provider at bedside. 

## 2017-04-17 ENCOUNTER — Ambulatory Visit (INDEPENDENT_AMBULATORY_CARE_PROVIDER_SITE_OTHER): Payer: BLUE CROSS/BLUE SHIELD | Admitting: Family Medicine

## 2017-04-17 ENCOUNTER — Ambulatory Visit (INDEPENDENT_AMBULATORY_CARE_PROVIDER_SITE_OTHER): Payer: BLUE CROSS/BLUE SHIELD

## 2017-04-17 ENCOUNTER — Encounter: Payer: Self-pay | Admitting: Family Medicine

## 2017-04-17 VITALS — BP 165/113 | HR 83 | Temp 97.5°F | Resp 16 | Ht 65.0 in | Wt 188.0 lb

## 2017-04-17 DIAGNOSIS — Q682 Congenital deformity of knee: Secondary | ICD-10-CM | POA: Diagnosis not present

## 2017-04-17 DIAGNOSIS — R112 Nausea with vomiting, unspecified: Secondary | ICD-10-CM

## 2017-04-17 DIAGNOSIS — M545 Low back pain, unspecified: Secondary | ICD-10-CM

## 2017-04-17 DIAGNOSIS — M25562 Pain in left knee: Secondary | ICD-10-CM | POA: Diagnosis not present

## 2017-04-17 DIAGNOSIS — G8929 Other chronic pain: Secondary | ICD-10-CM

## 2017-04-17 DIAGNOSIS — F411 Generalized anxiety disorder: Secondary | ICD-10-CM

## 2017-04-17 DIAGNOSIS — S83001A Unspecified subluxation of right patella, initial encounter: Secondary | ICD-10-CM

## 2017-04-17 DIAGNOSIS — M25362 Other instability, left knee: Secondary | ICD-10-CM

## 2017-04-17 DIAGNOSIS — R202 Paresthesia of skin: Secondary | ICD-10-CM | POA: Diagnosis not present

## 2017-04-17 DIAGNOSIS — I10 Essential (primary) hypertension: Secondary | ICD-10-CM

## 2017-04-17 DIAGNOSIS — D500 Iron deficiency anemia secondary to blood loss (chronic): Secondary | ICD-10-CM | POA: Diagnosis not present

## 2017-04-17 DIAGNOSIS — R42 Dizziness and giddiness: Secondary | ICD-10-CM

## 2017-04-17 DIAGNOSIS — F410 Panic disorder [episodic paroxysmal anxiety] without agoraphobia: Secondary | ICD-10-CM

## 2017-04-17 DIAGNOSIS — M47816 Spondylosis without myelopathy or radiculopathy, lumbar region: Secondary | ICD-10-CM

## 2017-04-17 DIAGNOSIS — F430 Acute stress reaction: Secondary | ICD-10-CM

## 2017-04-17 LAB — POCT URINALYSIS DIP (MANUAL ENTRY)
Bilirubin, UA: NEGATIVE
Blood, UA: NEGATIVE
Glucose, UA: NEGATIVE mg/dL
Ketones, POC UA: NEGATIVE mg/dL
Leukocytes, UA: NEGATIVE
Nitrite, UA: NEGATIVE
Protein Ur, POC: NEGATIVE mg/dL
Spec Grav, UA: 1.025 (ref 1.010–1.025)
Urobilinogen, UA: 0.2 E.U./dL
pH, UA: 6 (ref 5.0–8.0)

## 2017-04-17 LAB — POCT CBC
Granulocyte percent: 60.6 %G (ref 37–80)
HCT, POC: 32.7 % — AB (ref 37.7–47.9)
Hemoglobin: 9.8 g/dL — AB (ref 12.2–16.2)
Lymph, poc: 1.9 (ref 0.6–3.4)
MCH, POC: 20.4 pg — AB (ref 27–31.2)
MCHC: 29.9 g/dL — AB (ref 31.8–35.4)
MCV: 68.2 fL — AB (ref 80–97)
MID (cbc): 0.3 (ref 0–0.9)
MPV: 7.1 fL (ref 0–99.8)
POC Granulocyte: 3.5 (ref 2–6.9)
POC LYMPH PERCENT: 33.4 %L (ref 10–50)
POC MID %: 6 %M (ref 0–12)
Platelet Count, POC: 336 10*3/uL (ref 142–424)
RBC: 4.79 M/uL (ref 4.04–5.48)
RDW, POC: 15.7 %
WBC: 5.8 10*3/uL (ref 4.6–10.2)

## 2017-04-17 LAB — POC MICROSCOPIC URINALYSIS (UMFC): Mucus: ABSENT

## 2017-04-17 MED ORDER — HYDROCHLOROTHIAZIDE 25 MG PO TABS: 25.0000 mg | ORAL_TABLET | Freq: Every day | ORAL | 0 refills | Status: DC

## 2017-04-17 MED ORDER — ALPRAZOLAM 0.5 MG PO TABS: 0.5000 mg | ORAL_TABLET | Freq: Three times a day (TID) | ORAL | 1 refills | Status: DC | PRN

## 2017-04-17 MED ORDER — BUSPIRONE HCL 10 MG PO TABS: ORAL_TABLET | ORAL | 1 refills | Status: DC

## 2017-04-17 MED ORDER — CYCLOBENZAPRINE HCL 5 MG PO TABS: 5.0000 mg | ORAL_TABLET | Freq: Every evening | ORAL | 1 refills | Status: DC | PRN

## 2017-04-17 MED ORDER — DICLOFENAC SODIUM 75 MG PO TBEC: 75.0000 mg | DELAYED_RELEASE_TABLET | ORAL | 0 refills | Status: DC

## 2017-04-17 NOTE — Progress Notes (Signed)
Subjective:  By signing my name below, I, [name], attest that this documentation has been prepared under the direction and in the presence of Delman Cheadle, MD Electronically Signed: Ladene Artist, ED Scribe 04/17/2017 at 9:49 AM   Patient ID: Angela Brooks, female    DOB: 03/06/1970, 47 y.o.   MRN: [id number]  Chief Complaint  Patient presents with  . Follow-up    paresthesia, N&V still having    HPI Angela Brooks is a 47 y.o. female who presents to Primary Care at [facility] complaining of for f/u. H/o iron deficiency anemia. Labs 3 days ago in ER, hgb 9.5. Ionized calcium slightly low, neg pregnancy, otherwise normal. Pt was seen in the ER for L-sided numbness and difficulty swallowing. She presented with 24 hrs vomiting and diarrhea and was feeling upper airway restriction when breathing. She reported L-sided numbness in L leg that had been occurring for wks. She also endorsed several days of dizziness and vertigo. Neuro exam was normal. Given IV fluids and Zofran. Neg head CT. Normal EKG, labs normal other than mild anemia. Suspected dehydration from GI losses. - Vomiting and diarrhea is starting to subside.  Anxiety Pt was given xanax last yr which she said helped but she never returned for f/u when she ran out of medication and tried to manage anxiety on her own. States she is under a great deal of stress as her daughter now has another child. Pt reports that Zoloft did not work well for her.  L Knee Pain Pt reports gradually worsening L knee pain that radiates down into her foot or up into her upper leg, which she thinks is exacerbated with heavy lifting at work. Reports tingling sensation in her L leg x 1 month and entire L arm into her L fingers. Also reports weakness in her LLE and that her leg has given away twice last yr and again 6 months ago. She does report occasional L lower back pain, none at this time. She is currently alternating 2 Tylenol, 2 ibuprofen and 2 Aleve every other  day for knee pain. She has tried Flexeril for the L knee pain previously. Denies neck pain, injury. Pt has never seen ortho, has PT or injections.  HTN Some sob and intermittent HAs with blurry vision. Dizziness has improved over the past few wks but she still reports some lightheadedness with standing too quickly. Denies cp. She does not check her BP outside of the office.  Allergies Takes [employer] brand Claritin once/wk for allergies.  Past Medical History:  Diagnosis Date  . Allergy   . Anxiety   . Depression   . GERD (gastroesophageal reflux disease)   . Hypertension    Stopped taking meds x 1 year ago   No current outpatient medications on file prior to visit.   No current facility-administered medications on file prior to visit.    Past Surgical History:  Procedure Laterality Date  . CHOLECYSTECTOMY     No Known Allergies Family History  Problem Relation Age of Onset  . Hypertension Mother   . Diabetes Maternal Grandmother    Social History   Socioeconomic History  . Marital status: Single    Spouse name: Not on file  . Number of children: Not on file  . Years of education: Not on file  . Highest education level: Not on file  Occupational History  . Not on file  Social Needs  . Financial resource strain: Not on file  . Food insecurity:  Worry: Not on file    Inability: Not on file  . Transportation needs:    Medical: Not on file    Non-medical: Not on file  Tobacco Use  . Smoking status: Never Smoker  . Smokeless tobacco: Never Used  Substance and Sexual Activity  . Alcohol use: No    Alcohol/week: 0.0 standard drinks  . Drug use: No  . Sexual activity: Yes    Birth control/protection: None  Lifestyle  . Physical activity:    Days per week: Not on file    Minutes per session: Not on file  . Stress: Not on file  Relationships  . Social connections:    Talks on phone: Not on file    Gets together: Not on file    Attends religious service:  Not on file    Active member of club or organization: Not on file    Attends meetings of clubs or organizations: Not on file    Relationship status: Not on file  Other Topics Concern  . Not on file  Social History Narrative  . Not on file   Depression screen [facility] 2/9 04/17/2017 02/10/2016 01/13/2016 01/13/2016 06/10/2015  Decreased Interest 0 2 2 0 2  Down, Depressed, Hopeless 0 2 1 0 2  PHQ - 2 Score 0 4 3 0 4  Altered sleeping - 2 2 - 0  Tired, decreased energy - 1 2 - 2  Change in appetite - 0 2 - 0  Feeling bad or failure about yourself  - 0 1 - 0  Trouble concentrating - 0 1 - 0  Moving slowly or fidgety/restless - 0 0 - 0  Suicidal thoughts - 0 0 - 0  PHQ-9 Score - 7 11 - 6  Difficult doing work/chores - Somewhat difficult Somewhat difficult - Somewhat difficult      Review of Systems  Eyes: Positive for visual disturbance (intermittent).  Respiratory: Positive for shortness of breath (mild).   Cardiovascular: Negative for chest pain.  Gastrointestinal: Positive for nausea. Negative for diarrhea (resolved) and vomiting (resolved).  Musculoskeletal: Positive for arthralgias and back pain (occasional). Negative for neck pain.  Allergic/Immunologic: Positive for environmental allergies.  Neurological: Positive for dizziness, light-headedness, numbness (tingling) and headaches (intermittent).  Psychiatric/Behavioral: The patient is nervous/anxious.       Objective:   Physical Exam  Constitutional: She is oriented to person, place, and time. She appears well-developed and well-nourished. No distress.  HENT:  Head: Normocephalic and atraumatic.  Eyes: Conjunctivae and EOM are normal.  Neck: Neck supple. No tracheal deviation present. No thyromegaly present.  Cardiovascular: Normal rate, regular rhythm and normal heart sounds.  Pulmonary/Chest: Effort normal and breath sounds normal. No respiratory distress.  Musculoskeletal: Normal range of motion.  L knee: No bakers cyst or  cords palpable in popliteal fossa. Hamstring tendons tight. Patella without any laxity. No crepitus. FROM. No tenderness over lumbar spinal process, paraspinal muscles. No tenderness over SI joints or trochanteric bursa. Bilateral strength 5/5 except L hip flexors 4/5.  Lymphadenopathy:    She has cervical adenopathy.  Neurological: She is alert and oriented to person, place, and time.  Reflex Scores:      Patellar reflexes are 2+ on the right side and 2+ on the left side.      Achilles reflexes are 2+ on the right side. Skin: Skin is warm and dry.  Psychiatric: She has a normal mood and affect. Her behavior is normal.  Nursing note and vitals reviewed.  BP (!) 165/113   Pulse 83   Temp (!) 97.5 F (36.4 C)   Resp 16   Ht 5\' 5"  (1.651 m)   Wt 188 lb (85.3 kg)   LMP 03/25/2017 (Approximate)   SpO2 100%   BMI 31.28 kg/m     Results for orders placed or performed in visit on 04/17/17  POCT urinalysis dipstick  Result Value Ref Range   Color, UA yellow yellow   Clarity, UA clear clear   Glucose, UA negative negative mg/dL   Bilirubin, UA negative negative   Ketones, POC UA negative negative mg/dL   Spec Grav, UA 1.025 1.010 - 1.025   Blood, UA negative negative   pH, UA 6.0 5.0 - 8.0   Protein Ur, POC negative negative mg/dL   Urobilinogen, UA 0.2 0.2 or 1.0 E.U./dL   Nitrite, UA Negative Negative   Leukocytes, UA Negative Negative  POCT Microscopic Urinalysis ([facility])  Result Value Ref Range   WBC,UR,HPF,POC Few (A) None WBC/hpf   RBC,UR,HPF,POC None None RBC/hpf   Bacteria None None, Too numerous to count   Mucus Absent Absent   Epithelial Cells, UR Per Microscopy Moderate (A) None, Too numerous to count cells/hpf  POCT CBC  Result Value Ref Range   WBC 5.8 4.6 - 10.2 K/uL   Lymph, poc 1.9 0.6 - 3.4   POC LYMPH PERCENT 33.4 10 - 50 %L   MID (cbc) 0.3 0 - 0.9   POC MID % 6.0 0 - 12 %M   POC Granulocyte 3.5 2 - 6.9   Granulocyte percent 60.6 37 - 80 %G   RBC 4.79 4.04  - 5.48 M/uL   Hemoglobin 9.8 (A) 12.2 - 16.2 g/dL   HCT, POC 32.7 (A) 37.7 - 47.9 %   MCV 68.2 (A) 80 - 97 fL   MCH, POC 20.4 (A) 27 - 31.2 pg   MCHC 29.9 (A) 31.8 - 35.4 g/dL   RDW, POC 15.7 %   Platelet Count, POC 336 142 - 424 K/uL   MPV 7.1 0 - 99.8 fL   Dg Lumbar Spine Complete  Result Date: 04/17/2017 CLINICAL DATA:  Lumbago with left-sided radicular symptoms EXAM: LUMBAR SPINE - COMPLETE 4+ VIEW COMPARISON:  None. FINDINGS: Frontal, lateral, spot lumbosacral lateral, and bilateral oblique views were obtained. There are 5 non-rib-bearing lumbar type vertebral bodies. There is no fracture or spondylolisthesis. The disc spaces appear unremarkable. There is facet osteoarthritic change at L4-5 and L5-S1 bilaterally. IMPRESSION: Facet osteoarthritic change at L4-5 and L5-S1 bilaterally. No appreciable disc space narrowing. No fracture or spondylolisthesis. Electronically Signed   By: Lowella Grip III M.D.   On: 04/17/2017 10:43   Dg Knee 1-2 Views Left  Result Date: 04/17/2017 CLINICAL DATA:  Pain EXAM: LEFT KNEE - 1-2 VIEW COMPARISON:  None. FINDINGS: Frontal and lateral views were obtained. No fracture or dislocation. There is slight medial patellar subluxation. No joint effusion. Joint spaces appear normal. No erosive change. IMPRESSION: Slight medial patellar subluxation. No dislocation or fracture. No joint effusion. No appreciable arthropathy. Electronically Signed   By: Lowella Grip III M.D.   On: 04/17/2017 10:41   Assessment & Plan:   1. Paresthesias   2. Hypocalcemia   3. Iron deficiency anemia due to chronic blood loss   4. Non-intractable vomiting with nausea, unspecified vomiting type   5. Patella alta   6. Essential hypertension   7. Orthostatic lightheadedness   8. Chronic pain of left knee   9. Knee  instability, left   10. Low back pain potentially associated with radiculopathy   11. Anxiety state   12. Panic attack due to exceptional stress   13. Patellar  subluxation, right, initial encounter   14. Arthritis of facet joint of lumbar spine     Orders Placed This Encounter  Procedures  . DG Knee 1-2 Views Left    Standing Status:   Future    Number of Occurrences:   1    Standing Expiration Date:   04/17/2018    Order Specific Question:   Reason for Exam (SYMPTOM  OR DIAGNOSIS REQUIRED)    Answer:   progressively worsening pain in popliteal fossa, h/o patella alta; low back pain wit left leg radiculopathy weakness and int paresthesias    Order Specific Question:   Is the patient pregnant?    Answer:   No    Order Specific Question:   Preferred imaging location?    Answer:   External  . DG Lumbar Spine Complete    Standing Status:   Future    Number of Occurrences:   1    Standing Expiration Date:   04/17/2018    Order Specific Question:   Reason for Exam (SYMPTOM  OR DIAGNOSIS REQUIRED)    Answer:   progressively worsening pain in popliteal fossa, h/o patella alta; low back pain wit left leg radiculopathy weakness and int paresthesias    Order Specific Question:   Is the patient pregnant?    Answer:   No    Order Specific Question:   Preferred imaging location?    Answer:   External  . Iron, TIBC and Ferritin Panel  . Comprehensive metabolic panel  . Lipase  . VITAMIN D 25 Hydroxy (Vit-D Deficiency, Fractures)  . Vitamin B12  . RPR  . TSH  . Hemoglobin A1c  . Sedimentation Rate  . C-reactive protein  . Ambulatory referral to Orthopedic Surgery    Referral Priority:   Routine    Referral Type:   Surgical    Referral Reason:   Specialty Services Required    Requested Specialty:   Orthopedic Surgery    Number of Visits Requested:   1  . POCT urinalysis dipstick  . POCT Microscopic Urinalysis ([facility])  . POCT CBC    Meds ordered this encounter  Medications  . ALPRAZolam (XANAX) 0.5 MG tablet    Sig: Take 1 tablet (0.5 mg total) by mouth 3 (three) times daily as needed for anxiety.    Dispense:  90 tablet    Refill:  1  .  cyclobenzaprine (FLEXERIL) 5 MG tablet    Sig: Take 1 tablet (5 mg total) by mouth at bedtime as needed and may repeat dose one time if needed for muscle spasms.    Dispense:  30 tablet    Refill:  1  . diclofenac (VOLTAREN) 75 MG EC tablet    Sig: Take 1 tablet (75 mg total) by mouth every morning. And may repeat x 1 if needed for knee and low back pain    Dispense:  60 tablet    Refill:  0  . busPIRone (BUSPAR) 10 MG tablet    Sig: 1/2 tab po bid x3d, then 1 po qam and 1/2 tab po qhs x3d, then 1 po bid x1wk, then 1 1/2 tab po qam and 1 po qhs x3d, then 1.5 tabs po bid    Dispense:  90 tablet    Refill:  1  . hydrochlorothiazide (HYDRODIURIL)  25 MG tablet    Sig: Take 1 tablet (25 mg total) by mouth daily.    Dispense:  90 tablet    Refill:  0   I personally performed the services described in this documentation, which was scribed in my presence. The recorded information has been reviewed and considered, and addended by me as needed.   Delman Cheadle, M.D.  Primary Care at [facility] [address] [place], [place] [address] [phone] phone [phone] fax  10/03/17 5:30 AM

## 2017-04-17 NOTE — Patient Instructions (Addendum)
You are deficient on iron which is causing an anemia (low red blood cells).  I recommend starting a daily iron supplement like ferrous sulfate 339m (68mof elemental iron) which has the highest iron dose of the products commonly available over-the-counter.  It is best absorbed on an empty stomach 30 minutes before eating or 2 hours after a meal with a vitamin C supplement or a small glass of orange juice. This can cause significant constipation in some patients, in which case you may need to switch to a slow release, lower dose supplement like Nu-Iron or Slow-Fe.  I have also included a list of high iron foods below.   IF you received an x-ray today, you will [facility] an [facility]. Please contact [facility] at [phone] questions or concerns regarding your invoice.   IF you received labwork today, you will receive an invoice from [facility] at [phone] questions or concerns regarding your invoice.   Our billing staff will not be able to assist you with questions regarding bills from these companies.  You will be contacted with the lab results as soon as they are available. The fastest way to get your results is to activate your My Chart account. Instructions are located on the last page of this paperwork. If you have not heard from [place] results in 2 weeks, please contact this office.     Iron Deficiency Anemia, Adult Iron deficiency anemia is a condition in which the concentration of red blood cells or hemoglobin in the blood is below normal because of too little iron. Hemoglobin is a substance in red blood cells that carries oxygen to the body's tissues. When the concentration of red blood cells or hemoglobin is too low, not enough oxygen reaches these tissues. Iron deficiency anemia is usually long-lasting (chronic) and it develops over time. It may or may not cause symptoms. It is a common type of anemia. What  are the causes? This condition may be caused by:  Not enough iron in the diet.  Blood loss caused by bleeding in the intestine.  Blood loss from a gastrointestinal condition like Crohn disease.  Frequent blood draws, such as from blood donation.  Abnormal absorption in the gut.  Heavy menstrual periods in women.  Cancers of the gastrointestinal system, such as colon cancer.  What are the signs or symptoms? Symptoms of this condition may include:  Fatigue.  Headache.  Pale skin, lips, and nail beds.  Poor appetite.  Weakness.  Shortness of breath.  Dizziness.  Cold hands and feet.  Fast or irregular heartbeat.  Irritability. This is more common in severe anemia.  Rapid breathing. This is more common in severe anemia.  Mild anemia may not cause any symptoms. How is this diagnosed? This condition is diagnosed based on:  Your medical history.  A physical exam.  Blood tests.  You may have additional tests to find the underlying cause of your anemia, such as:  Testing for blood in the stool (fecal occult blood test).  A procedure to see inside your colon and rectum (colonoscopy).  A procedure to see inside your esophagus and stomach (endoscopy).  A test in which cells are removed from bone marrow (bone marrow aspiration) or fluid is removed from the bone marrow to be examined (biopsy). This is rarely needed.  How is this treated? This condition is treated by correcting the cause of your iron deficiency. Treatment may involve:  Adding iron-rich foods  to your diet.  Taking iron supplements. If you are pregnant or breastfeeding, you may need to take extra iron because your normal diet usually does not provide the amount of iron that you need.  Increasing vitamin C intake. Vitamin C helps your body absorb iron. Your health care provider may recommend that you take iron supplements along with a glass of orange juice or a vitamin C supplement.  Medicines to  make heavy menstrual flow lighter.  Surgery.  You may need repeat blood tests to determine whether treatment is working. Depending on the underlying cause, the anemia should be corrected within 2 months of starting treatment. If the treatment does not seem to be working, you may need more testing. Follow these instructions at home: Medicines  Take over-the-counter and prescription medicines only as told by your health care provider. This includes iron supplements and vitamins.  If you cannot tolerate taking iron supplements by mouth, talk with your health care provider about taking them through a vein (intravenously) or an injection into a muscle.  For the best iron absorption, you should take iron supplements when your stomach is empty. If you cannot tolerate them on an empty stomach, you may need to take them with food.  Do not drink milk or take antacids at the same time as your iron supplements. Milk and antacids may interfere with iron absorption.  Iron supplements can cause constipation. To prevent constipation, include fiber in your diet as told by your health care provider. A stool softener may also be recommended. Eating and drinking  Talk with your health care provider before changing your diet. He or she may recommend that you eat foods that contain a lot of iron, such as: ? Liver. ? Low-fat (lean) beef. ? Breads and cereals that have iron added to them (are fortified). ? Eggs. ? Dried fruit. ? Dark green, leafy vegetables.  To help your body use the iron from iron-rich foods, eat those foods at the same time as fresh fruits and vegetables that are high in vitamin C. Foods that are high in vitamin C include: ? Oranges. ? Peppers. ? Tomatoes. ? Mangoes.  Drinkenoughfluid to keep your urine clear or pale yellow. General instructions  Return to your normal activities as told by your health care provider. Ask your health care provider what activities are safe for  you.  Practice good hygiene. Anemia can make you more prone to illness and infection.  Keep all follow-up visits as told by your health care provider. This is important. Contact a health care provider if:  You feel nauseous or you vomit.  You feel weak.  You have unexplained sweating.  You develop symptoms of constipation, such as: ? Having fewer than three bowel movements a week. ? Straining to have a bowel movement. ? Having stools that are hard, dry, or larger than normal. ? Feeling full or bloated. ? Pain in the lower abdomen. ? Not feeling relief after having a bowel movement. Get help right away if:  You faint. If this happens, do not drive yourself to the hospital. Call your local emergency services (911 in the [place]  You have chest pain.  You have shortness of breath that: ? Is severe. ? Gets worse with physical activity.  You have a rapid heartbeat.  You become light-headed when getting up from a sitting or lying down position. This information is not intended to replace advice given to you by your health care provider. Make sure you discuss any  questions you have with your health care provider. Document Released: 12/22/1999 Document Revised: 09/13/2015 Document Reviewed: 09/13/2015 Elsevier Interactive Patient Education  2018 [employer].   Iron-Rich Diet Iron is a mineral that helps your body to produce hemoglobin. Hemoglobin is a protein in your red blood cells that carries oxygen to your body's tissues. Eating too little iron may cause you to feel weak and tired, and it can increase your risk for infection. Eating enough iron is necessary for your body's metabolism, muscle function, and nervous system. Iron is naturally found in many foods. It can also be added to foods or fortified in foods. There are two types of dietary iron:  Heme iron. Heme iron is absorbed by the body more easily than nonheme iron. Heme iron is found in meat, poultry, and  fish.  Nonheme iron. Nonheme iron is found in dietary supplements, iron-fortified grains, beans, and vegetables.  You may need to follow an iron-rich diet if:  You have been diagnosed with iron deficiency or iron-deficiency anemia.  You have a condition that prevents you from absorbing dietary iron, such as: ? Infection in your intestines. ? Celiac disease. This involves long-lasting (chronic) inflammation of your intestines.  You do not eat enough iron.  You eat a diet that is high in foods that impair iron absorption.  You have lost a lot of blood.  You have heavy bleeding during your menstrual cycle.  You are pregnant.  What is my plan? Your health care provider may help you to determine how much iron you need per day based on your condition. Generally, when a person consumes sufficient amounts of iron in the diet, the following iron needs are met:  Men. ? 33-65 years old: 11 mg per day. ? 43-59 years old: 8 mg per day.  Women. ? 32-35 years old: 15 mg per day. ? 5-31 years old: 18 mg per day. ? Over 35 years old: 8 mg per day. ? Pregnant women: 27 mg per day. ? Breastfeeding women: 9 mg per day.  What do I need to know about an iron-rich diet?  Eat fresh fruits and vegetables that are high in vitamin C along with foods that are high in iron. This will help increase the amount of iron that your body absorbs from food, especially with foods containing nonheme iron. Foods that are high in vitamin C include oranges, peppers, tomatoes, and mango.  Take iron supplements only as directed by your health care provider. Overdose of iron can be life-threatening. If you were prescribed iron supplements, take them with orange juice or a vitamin C supplement.  Cook foods in pots and pans that are made from iron.  Eat nonheme iron-containing foods alongside foods that are high in heme iron. This helps to improve your iron absorption.  Certain foods and drinks contain compounds  that impair iron absorption. Avoid eating these foods in the same meal as iron-rich foods or with iron supplements. These include: ? Coffee, black tea, and red wine. ? Milk, dairy products, and foods that are high in calcium. ? Beans, soybeans, and peas. ? Whole grains.  When eating foods that contain both nonheme iron and compounds that impair iron absorption, follow these tips to absorb iron better. ? Soak beans overnight before cooking. ? Soak whole grains overnight and drain them before using. ? Ferment flours before baking, such as using yeast in bread dough. What foods can I eat? Grains Iron-fortified breakfast cereal. Iron-fortified whole-wheat bread. Enriched rice. Sprouted  grains. Vegetables Spinach. Potatoes with skin. Green peas. Broccoli. Red and green bell peppers. Fermented vegetables. Fruits Prunes. Raisins. Oranges. Strawberries. Mango. Grapefruit. Meats and Other Protein Sources Beef liver. Oysters. Beef. Shrimp. [place]. Chicken. [place]. Sardines. Chickpeas. Nuts. Tofu. Beverages Tomato juice. Fresh orange juice. Prune juice. Hibiscus tea. Fortified instant breakfast shakes. Condiments Tahini. Fermented soy sauce. Sweets and Desserts Black-strap molasses. Other Wheat germ. The items listed above may not be a complete list of recommended foods or beverages. Contact your dietitian for more options. What foods are not recommended? Grains Whole grains. Bran cereal. Bran flour. Oats. Vegetables Artichokes. Brussels sprouts. Kale. Fruits Blueberries. Raspberries. Strawberries. Figs. Meats and Other Protein Sources Soybeans. Products made from soy protein. Dairy Milk. Cream. Cheese. Yogurt. Cottage cheese. Beverages Coffee. Black tea. Red wine. Sweets and Desserts Cocoa. Chocolate. Ice cream. Other Basil. Oregano. Parsley. The items listed above may not be a complete list of foods and beverages to avoid. Contact your dietitian for more information. This  information is not intended to replace advice given to you by your health care provider. Make sure you discuss any questions you have with your health care provider. Document Released: 08/07/2004 Document Revised: 07/14/2015 Document Reviewed: 07/21/2013 Elsevier Interactive Patient Education  [employer].

## 2017-04-18 LAB — COMPREHENSIVE METABOLIC PANEL
ALT: 17 IU/L (ref 0–32)
AST: 17 IU/L (ref 0–40)
Albumin/Globulin Ratio: 1.4 (ref 1.2–2.2)
Albumin: 4.3 g/dL (ref 3.5–5.5)
Alkaline Phosphatase: 79 IU/L (ref 39–117)
BUN/Creatinine Ratio: 14 (ref 9–23)
BUN: 13 mg/dL (ref 6–24)
Bilirubin Total: 0.4 mg/dL (ref 0.0–1.2)
CO2: 23 mmol/L (ref 20–29)
Calcium: 9.1 mg/dL (ref 8.7–10.2)
Chloride: 103 mmol/L (ref 96–106)
Creatinine, Ser: 0.92 mg/dL (ref 0.57–1.00)
GFR calc Af Amer: 86 mL/min/{1.73_m2} (ref >59)
GFR calc non Af Amer: 74 mL/min/{1.73_m2} (ref >59)
Globulin, Total: 3.1 g/dL (ref 1.5–4.5)
Glucose: 90 mg/dL (ref 65–99)
Potassium: 3.8 mmol/L (ref 3.5–5.2)
Sodium: 141 mmol/L (ref 134–144)
Total Protein: 7.4 g/dL (ref 6.0–8.5)

## 2017-04-18 LAB — LIPASE: Lipase: 37 U/L (ref 14–72)

## 2017-04-18 LAB — IRON,TIBC AND FERRITIN PANEL
Ferritin: 8 ng/mL — ABNORMAL LOW (ref 15–150)
Iron Saturation: 5 % — CL (ref 15–55)
Iron: 19 ug/dL — ABNORMAL LOW (ref 27–159)
Total Iron Binding Capacity: 415 ug/dL (ref 250–450)
UIBC: 396 ug/dL (ref 131–425)

## 2017-04-18 LAB — C-REACTIVE PROTEIN: CRP: 2.4 mg/L (ref 0.0–4.9)

## 2017-04-18 LAB — SEDIMENTATION RATE: Sed Rate: 51 mm/hr — ABNORMAL HIGH (ref 0–32)

## 2017-04-18 LAB — VITAMIN B12: Vitamin B-12: 713 pg/mL (ref 232–1245)

## 2017-04-18 LAB — VITAMIN D 25 HYDROXY (VIT D DEFICIENCY, FRACTURES): Vit D, 25-Hydroxy: 22.8 ng/mL — ABNORMAL LOW (ref 30.0–100.0)

## 2017-04-18 LAB — HEMOGLOBIN A1C
Est. average glucose Bld gHb Est-mCnc: 111 mg/dL
Hgb A1c MFr Bld: 5.5 % (ref 4.8–5.6)

## 2017-04-18 LAB — TSH: TSH: 1.52 u[IU]/mL (ref 0.450–4.500)

## 2017-04-18 LAB — RPR: RPR Ser Ql: NONREACTIVE

## 2017-06-07 ENCOUNTER — Other Ambulatory Visit: Payer: Self-pay | Admitting: Family Medicine

## 2017-06-13 ENCOUNTER — Other Ambulatory Visit: Payer: Self-pay | Admitting: Family Medicine

## 2017-06-13 NOTE — Telephone Encounter (Signed)
Refill of Voltaren  LOV 04/17/17  Dr. Brigitte Pulse  [facility] 04/17/17 #[phone]   [facility]  [id number] [place]

## 2017-06-19 ENCOUNTER — Ambulatory Visit: Payer: BLUE CROSS/BLUE SHIELD | Admitting: Family Medicine

## 2017-08-20 ENCOUNTER — Other Ambulatory Visit: Payer: Self-pay | Admitting: Family Medicine

## 2017-08-20 NOTE — Telephone Encounter (Signed)
HCTZ refill Last Refill:04/17/17 # 90 Last OV: 04/17/17 PCP: Dr Brigitte Pulse Pharmacy:[facility] [address] Pt was a no show for f/u visit that was scheduled 06/19/17

## 2017-12-29 ENCOUNTER — Other Ambulatory Visit: Payer: Self-pay

## 2017-12-29 ENCOUNTER — Emergency Department (HOSPITAL_COMMUNITY)
Admission: EM | Admit: 2017-12-29 | Discharge: 2017-12-30 | Disposition: A | Discharge status: Home / Self Care | Payer: BLUE CROSS/BLUE SHIELD | Attending: Emergency Medicine | Admitting: Emergency Medicine

## 2017-12-29 ENCOUNTER — Encounter (HOSPITAL_COMMUNITY): Payer: Self-pay | Admitting: *Deleted

## 2017-12-29 DIAGNOSIS — D259 Leiomyoma of uterus, unspecified: Secondary | ICD-10-CM

## 2017-12-29 DIAGNOSIS — R0789 Other chest pain: Secondary | ICD-10-CM | POA: Insufficient documentation

## 2017-12-29 DIAGNOSIS — K429 Umbilical hernia without obstruction or gangrene: Secondary | ICD-10-CM | POA: Diagnosis not present

## 2017-12-29 DIAGNOSIS — R1032 Left lower quadrant pain: Secondary | ICD-10-CM | POA: Insufficient documentation

## 2017-12-29 DIAGNOSIS — R112 Nausea with vomiting, unspecified: Secondary | ICD-10-CM | POA: Diagnosis not present

## 2017-12-29 DIAGNOSIS — I10 Essential (primary) hypertension: Secondary | ICD-10-CM | POA: Insufficient documentation

## 2017-12-29 DIAGNOSIS — R1012 Left upper quadrant pain: Secondary | ICD-10-CM | POA: Diagnosis present

## 2017-12-29 LAB — COMPREHENSIVE METABOLIC PANEL
ALT: 19 U/L (ref 0–44)
AST: 19 U/L (ref 15–41)
Albumin: 4 g/dL (ref 3.5–5.0)
Alkaline Phosphatase: 53 U/L (ref 38–126)
Anion gap: 11 (ref 5–15)
BUN: 13 mg/dL (ref 6–20)
CO2: 26 mmol/L (ref 22–32)
Calcium: 9.4 mg/dL (ref 8.9–10.3)
Chloride: 102 mmol/L (ref 98–111)
Creatinine, Ser: 0.81 mg/dL (ref 0.44–1.00)
GFR calc Af Amer: >60 mL/min (ref >60)
GFR calc non Af Amer: >60 mL/min (ref >60)
Glucose, Bld: 94 mg/dL (ref 70–99)
Potassium: 3.5 mmol/L (ref 3.5–5.1)
Sodium: 139 mmol/L (ref 135–145)
Total Bilirubin: 1 mg/dL (ref 0.3–1.2)
Total Protein: 8 g/dL (ref 6.5–8.1)

## 2017-12-29 LAB — CBC
HCT: 28.8 % — ABNORMAL LOW (ref 36.0–46.0)
Hemoglobin: 8 g/dL — ABNORMAL LOW (ref 12.0–15.0)
MCH: 18.4 pg — ABNORMAL LOW (ref 26.0–34.0)
MCHC: 27.8 g/dL — ABNORMAL LOW (ref 30.0–36.0)
MCV: 66.2 fL — ABNORMAL LOW (ref 80.0–100.0)
Platelets: 376 10*3/uL (ref 150–400)
RBC: 4.35 MIL/uL (ref 3.87–5.11)
RDW: 17.3 % — ABNORMAL HIGH (ref 11.5–15.5)
WBC: 9.4 10*3/uL (ref 4.0–10.5)
nRBC: 0 % (ref 0.0–0.2)

## 2017-12-29 LAB — URINALYSIS, ROUTINE W REFLEX MICROSCOPIC
Bilirubin Urine: NEGATIVE
Glucose, UA: NEGATIVE mg/dL
Hgb urine dipstick: NEGATIVE
Ketones, ur: NEGATIVE mg/dL
Leukocytes, UA: NEGATIVE
Nitrite: NEGATIVE
Protein, ur: NEGATIVE mg/dL
Specific Gravity, Urine: 1.017 (ref 1.005–1.030)
pH: 7 (ref 5.0–8.0)

## 2017-12-29 LAB — I-STAT TROPONIN, ED: Troponin i, poc: 0 ng/mL (ref 0.00–0.08)

## 2017-12-29 LAB — TROPONIN I: Troponin I: <0.03 ng/mL (ref <0.03)

## 2017-12-29 LAB — LIPASE, BLOOD: Lipase: 30 U/L (ref 11–51)

## 2017-12-29 NOTE — ED Triage Notes (Signed)
The pt is c/o abd pain nausea vomiting and sob all day

## 2017-12-29 NOTE — ED Notes (Signed)
Pt called out to staff while in waiting room; was visibly breathing harder and describing an increase in nausea. RN, [place] notified.

## 2017-12-30 ENCOUNTER — Emergency Department (HOSPITAL_COMMUNITY): Payer: BLUE CROSS/BLUE SHIELD

## 2017-12-30 LAB — TYPE AND SCREEN
ABO/RH(D): B POS
Antibody Screen: NEGATIVE

## 2017-12-30 LAB — HCG, QUANTITATIVE, PREGNANCY: hCG, Beta Chain, Quant, S: <1 m[IU]/mL (ref <5)

## 2017-12-30 LAB — ABO/RH: ABO/RH(D): B POS

## 2017-12-30 MED ORDER — FENTANYL CITRATE (PF) 100 MCG/2ML IJ SOLN
100.0000 ug | Freq: Once | INTRAMUSCULAR | Status: AC
  Administered 2017-12-30: 100 ug via INTRAVENOUS
  Filled 2017-12-30: qty 2

## 2017-12-30 MED ORDER — HYDROCODONE-ACETAMINOPHEN 5-325 MG PO TABS: 1.0000 | ORAL_TABLET | Freq: Four times a day (QID) | ORAL | 0 refills | Status: DC | PRN

## 2017-12-30 MED ORDER — ONDANSETRON HCL 4 MG/2ML IJ SOLN
4.0000 mg | Freq: Once | INTRAMUSCULAR | Status: AC
  Administered 2017-12-30: 4 mg via INTRAVENOUS
  Filled 2017-12-30: qty 2

## 2017-12-30 MED ORDER — DOCUSATE SODIUM 100 MG PO CAPS: 100.0000 mg | ORAL_CAPSULE | Freq: Two times a day (BID) | ORAL | 0 refills | Status: DC

## 2017-12-30 MED ORDER — IOHEXOL 300 MG/ML  SOLN
100.0000 mL | Freq: Once | INTRAMUSCULAR | Status: AC | PRN
  Administered 2017-12-30: 100 mL via INTRAVENOUS

## 2017-12-30 MED ORDER — HYDROCODONE-ACETAMINOPHEN 5-325 MG PO TABS
1.0000 | ORAL_TABLET | Freq: Once | ORAL | Status: AC
  Administered 2017-12-30: 1 via ORAL
  Filled 2017-12-30: qty 1

## 2017-12-30 NOTE — ED Provider Notes (Signed)
[place] EMERGENCY DEPARTMENT Provider Note   CSN: [id number] Arrival date & time: 12/29/17  1905     History   Chief Complaint Chief Complaint  Patient presents with  . Abdominal Pain  . chest    HPI Angela Brooks is a 47 y.o. female.  The history is provided by the patient and a significant other.  Abdominal Pain   This is a new problem. The current episode started 6 to 12 hours ago. The problem occurs constantly. The problem has been gradually worsening. The pain is located in the LUQ and LLQ. The pain is moderate. Associated symptoms include hematochezia, nausea, vomiting and constipation. Pertinent negatives include fever, diarrhea and dysuria. The symptoms are aggravated by palpation. Nothing relieves the symptoms.  Patient with history of depression and hypertension presents with abdominal pain.  She reports this is been progressively worsening with associated nonbloody vomiting.  She reports constipation, but did have recent stools with blood mixed in. No vaginal bleeding.  This is not a pain she gets frequently. Her only abdominal surgery previously is cholecystectomy Past Medical History:  Diagnosis Date  . Allergy   . Anxiety   . Depression   . GERD (gastroesophageal reflux disease)   . Hypertension    Stopped taking meds x 1 year ago    Patient Active Problem List   Diagnosis Date Noted  . 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 Iron deficiency anemia due to chronic blood loss 04/17/2017  . Grief 09/28/2014  . Essential hypertension 09/28/2014  . Dental caries extending into pulp 02/06/2014  . GERD (gastroesophageal reflux disease) 05/14/2011    Past Surgical History:  Procedure Laterality Date  . CHOLECYSTECTOMY       OB History    Gravida  1   Para      Term      Preterm      AB  1   Living  1     SAB  1   TAB      Ectopic      Multiple      Live Births               Home Medications    Prior to Admission  medications   Medication Sig Start Date End Date Taking? Authorizing Provider  ALPRAZolam Duanne Moron) 0.5 MG tablet Take 1 tablet (0.5 mg total) by mouth 3 (three) times daily as needed for anxiety. 04/17/17   Shawnee Knapp, MD  busPIRone (BUSPAR) 10 MG tablet 1/2 tab po bid x3d, then 1 po qam and 1/2 tab po qhs x3d, then 1 po bid x1wk, then 1 1/2 tab po qam and 1 po qhs x3d, then 1.5 tabs po bid 04/17/17   Shawnee Knapp, MD  cyclobenzaprine (FLEXERIL) 5 MG tablet Take 1 tablet (5 mg total) by mouth at bedtime as needed and may repeat dose one time if needed for muscle spasms. 04/17/17   Shawnee Knapp, MD  diclofenac (VOLTAREN) 75 MG EC tablet Take 1 tablet (75 mg total) by mouth every morning. And may repeat x 1 if needed for knee and low back pain 04/17/17   Shawnee Knapp, MD  hydrochlorothiazide (HYDRODIURIL) 25 MG tablet TAKE 1 TABLET BY MOUTH ONCE DAILY 08/20/17   Shawnee Knapp, MD    Family History Family History  Problem Relation Age of Onset  . Hypertension Mother   . Diabetes Maternal Grandmother     Social History Social History  Tobacco Use  . Smoking status: Never Smoker  . Smokeless tobacco: Never Used  Substance Use Topics  . Alcohol use: No    Alcohol/week: 0.0 standard drinks  . Drug use: No     Allergies   Patient has no known allergies.   Review of Systems Review of Systems  Constitutional: Negative for fever.  Respiratory: Positive for shortness of breath.   Cardiovascular:       Denies chest pain  Gastrointestinal: Positive for abdominal pain, blood in stool, constipation, hematochezia, nausea and vomiting. Negative for diarrhea.  Genitourinary: Negative for dysuria and vaginal bleeding.  All other systems reviewed and are negative.    Physical Exam Updated Vital Signs BP 138/71   Pulse 79   Temp 98 F (36.7 C) (Oral)   Resp (!) 21   Ht 1.651 m (5\' 5" )   Wt 83.9 kg   SpO2 100%   BMI 30.79 kg/m   Physical Exam CONSTITUTIONAL: Well developed/well  nourished HEAD: Normocephalic/atraumatic EYES: EOMI/PERRL ENMT: Mucous membranes moist NECK: supple no meningeal signs SPINE/BACK:entire spine nontender CV: S1/S2 noted, no murmurs/rubs/gallops noted LUNGS: Lungs are clear to auscultation bilaterally, no apparent distress ABDOMEN: soft, moderate left upper quadrant and left lower quadrant tenderness, decreased bowel sounds, no rebound or guarding Rectal-stool color yellow, no melena or blood, nurse Seth Bake present for exam GU:no cva tenderness NEURO: Pt is awake/alert/appropriate, moves all extremitiesx4.  No facial droop.   EXTREMITIES: pulses normal/equal, full ROM SKIN: warm, color normal PSYCH: no abnormalities of mood noted, alert and oriented to situation   ED Treatments / Results  Labs (all labs ordered are listed, but only abnormal results are displayed) Labs Reviewed  CBC - Abnormal; Notable for the following components:      Result Value   Hemoglobin 8.0 (*)    HCT 28.8 (*)    MCV 66.2 (*)    MCH 18.4 (*)    MCHC 27.8 (*)    RDW 17.3 (*)    All other components within normal limits  LIPASE, BLOOD  COMPREHENSIVE METABOLIC PANEL  URINALYSIS, ROUTINE W REFLEX MICROSCOPIC  TROPONIN I  HCG, QUANTITATIVE, PREGNANCY  I-STAT TROPONIN, ED  TYPE AND SCREEN  ABO/RH    EKG EKG Interpretation  Date/Time:  Monday December 29 2017 19:20:45 EST Ventricular Rate:  73 PR Interval:  158 QRS Duration: 80 QT Interval:  398 QTC Calculation: 438 R Axis:   57 Text Interpretation:  Normal sinus rhythm Normal ECG No significant change since last tracing Confirmed by Ripley Fraise [phone]) on 12/30/2017 1:24:28 AM   Radiology Ct Abdomen Pelvis W Contrast  Result Date: 12/30/2017 CLINICAL DATA:  Acute onset of generalized abdominal pain, nausea, vomiting and shortness of breath. EXAM: CT ABDOMEN AND PELVIS WITH CONTRAST TECHNIQUE: Multidetector CT imaging of the abdomen and pelvis was performed using the standard protocol  following bolus administration of intravenous contrast. CONTRAST:  126mL OMNIPAQUE IOHEXOL 300 MG/ML  SOLN COMPARISON:  None. FINDINGS: Lower chest: The visualized lung bases are grossly clear. The visualized portions of the mediastinum are unremarkable. Hepatobiliary: The liver is unremarkable in appearance. The patient is status post cholecystectomy, with clips noted at the gallbladder fossa. The common bile duct remains normal in caliber. Pancreas: The pancreas is within normal limits. Spleen: The spleen is unremarkable in appearance. Adrenals/Urinary Tract: The adrenal glands are unremarkable in appearance. The kidneys are within normal limits. There is no evidence of hydronephrosis. No renal or ureteral stones are identified. No perinephric stranding is seen.  Stomach/Bowel: The stomach is unremarkable in appearance. The small bowel is within normal limits. The appendix is normal in caliber, without evidence of appendicitis. The colon is unremarkable in appearance. Vascular/Lymphatic: The abdominal aorta is unremarkable in appearance. The inferior vena cava is grossly unremarkable. No retroperitoneal lymphadenopathy is seen. No pelvic sidewall lymphadenopathy is identified. Reproductive: The bladder is decompressed and not well characterized. Multiple uterine fibroids are noted. The uterus is otherwise grossly unremarkable. The ovaries are relatively symmetric. No suspicious adnexal masses are seen. Other: A small to moderate anterior abdominal wall hernia is noted above the umbilicus, containing only fat and fluid. This may correspond to the patient's symptoms. A small umbilical hernia is also seen, containing only fat. Musculoskeletal: No acute osseous abnormalities are identified. The visualized musculature is unremarkable in appearance. IMPRESSION: 1. Small to moderate anterior abdominal wall hernia noted above the umbilicus, containing only fat and fluid. This may correspond to the patient's symptoms. 2.  Small umbilical hernia, containing only fat. 3. Multiple uterine fibroids noted. Electronically Signed   By: Garald Balding M.D.   On: 12/30/2017 04:07    Procedures Procedures Medications Ordered in ED Medications  HYDROcodone-acetaminophen (NORCO/VICODIN) 5-325 MG per tablet 1 tablet (has no administration in time range)  fentaNYL (SUBLIMAZE) injection 100 mcg (100 mcg Intravenous Given 12/30/17 0151)  ondansetron (ZOFRAN) injection 4 mg (4 mg Intravenous Given 12/30/17 0150)  iohexol (OMNIPAQUE) 300 MG/ML solution 100 mL (100 mLs Intravenous Contrast Given 12/30/17 0351)     Initial Impression / Assessment and Plan / ED Course  I have reviewed the triage vital signs and the nursing notes.  Pertinent labs  results that were available during my care of the patient were reviewed by me and considered in my medical decision making (see chart for details).     2:26 AM Patient presents with worsening abdominal pain and vomiting and constipation.  She will require CT imaging.  She is also noted to have acute on chronic anemia.  No signs of rectal bleed, denies vaginal bleeding. Imaging is pending at this time 4:49 AM CT reveals hernia without acute obstruction.  Also reports fibroids.  She admits to having heavy vaginal bleeding with every menstrual cycle.  Suspect her anemia is somewhat chronic due to fibroid uterus causing heavy bleeding.  Advised her to follow-up with GYN. 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  We discussed ways to prevent worsening hernia.  We also discussed strict ER return precautions.  She is also advised to take iron for her anemia. Final Clinical Impressions(s) / ED Diagnoses   Final diagnoses:  Umbilical hernia without obstruction and without gangrene   Uterine leiomyoma, unspecified location    ED Discharge Orders         Ordered    HYDROcodone-acetaminophen (NORCO/VICODIN) 5-325 MG tablet  Every 6 hours PRN     12/30/17 0448    docusate sodium (COLACE) 100 MG capsule  Every 12 hours     12/30/17 0448           Ripley Fraise, MD 12/30/17 [phone]

## 2017-12-30 NOTE — ED Notes (Signed)
Patient transported to CT 

## 2018-01-01 ENCOUNTER — Ambulatory Visit (INDEPENDENT_AMBULATORY_CARE_PROVIDER_SITE_OTHER): Payer: BLUE CROSS/BLUE SHIELD | Admitting: Obstetrics and Gynecology

## 2018-01-01 ENCOUNTER — Encounter: Payer: Self-pay | Admitting: Obstetrics and Gynecology

## 2018-01-01 VITALS — BP 155/92 | HR 85 | Wt 184.0 lb

## 2018-01-01 DIAGNOSIS — D500 Iron deficiency anemia secondary to blood loss (chronic): Secondary | ICD-10-CM

## 2018-01-01 DIAGNOSIS — D219 Benign neoplasm of connective and other soft tissue, unspecified: Secondary | ICD-10-CM | POA: Diagnosis not present

## 2018-01-01 DIAGNOSIS — N920 Excessive and frequent menstruation with regular cycle: Secondary | ICD-10-CM | POA: Insufficient documentation

## 2018-01-01 NOTE — Patient Instructions (Signed)
Vaginal Hysterectomy  A vaginal hysterectomy is a procedure to remove all or part of the uterus through a small incision in the vagina. In this procedure, your health care provider may remove your entire uterus, including the lower end (cervix). You may need a vaginal hysterectomy to treat:  Uterine fibroids.  A condition that causes the lining of the uterus to grow in other areas (endometriosis).  Problems with pelvic support.  Cancer of the cervix, ovaries, uterus, or tissue that lines the uterus (endometrium).  Excessive (dysfunctional) uterine bleeding. When removing your uterus, your health care provider may also remove the organs that produce eggs (ovaries) and the tubes that carry eggs to your uterus (fallopian tubes). After a vaginal hysterectomy, you will no longer be able to have a baby. You will also no longer get your menstrual period. Tell a health care provider about:  Any allergies you have.  All medicines you are taking, including vitamins, herbs, eye drops, creams, and over-the-counter medicines.  Any problems you or family members have had with anesthetic medicines.  Any blood disorders you have.  Any surgeries you have had.  Any medical conditions you have.  Whether you are pregnant or may be pregnant. What are the risks? Generally, this is a safe procedure. However, problems may occur, including:  Bleeding.  Infection.  A blood clot that forms in your leg and travels to your lungs (pulmonary embolism).  Damage to surrounding organs.  Pain during sex. What happens before the procedure?  Ask your health care provider what organs will be removed during surgery.  Ask your health care provider about: ? Changing or stopping your regular medicines. This is especially important if you are taking diabetes medicines or blood thinners. ? Taking medicines such as aspirin and ibuprofen. These medicines can thin your blood. Do not take these medicines before your  procedure if your health care provider instructs you not to.  Follow instructions from your health care provider about eating or drinking restrictions.  Do not use any tobacco products, such as cigarettes, chewing tobacco, and e-cigarettes. If you need help quitting, ask your health care provider.  Plan to have someone take you home after discharge from the hospital. What happens during the procedure?  To reduce your risk of infection: ? Your health care team will wash or sanitize their hands. ? Your skin will be washed with soap.  An IV tube will be inserted into one of your veins.  You may be given antibiotic medicine to help prevent infection.  You will be given one or more of the following: ? A medicine to help you relax (sedative). ? A medicine to numb the area (local anesthetic). ? A medicine to make you fall asleep (general anesthetic). ? A medicine that is injected into an area of your body to numb everything beyond the injection site (regional anesthetic).  Your surgeon will make an incision in your vagina.  Your surgeon will locate and remove all or part of your uterus.  Your ovaries and fallopian tubes may be removed at the same time.  The incision will be closed with stitches (sutures) that dissolve over time. The procedure may vary among health care providers and hospitals. What happens after the procedure?  Your blood pressure, heart rate, breathing rate, and blood oxygen level will be monitored often until the medicines you were given have worn off.  You will be encouraged to get up and walk around after a few hours to help prevent complications.  You may have IV tubes in place for a few days.  You will be given pain medicine as needed.  Do not drive for 24 hours if you were given a sedative. This information is not intended to replace advice given to you by your health care provider. Make sure you discuss any questions you have with your health care provider.  Document Released: 04/17/2015 Document Revised: 06/01/2015 Document Reviewed: 01/08/2015 Elsevier Interactive Patient Education  2019 Elsevier Inc.  

## 2018-01-01 NOTE — Progress Notes (Signed)
Angela Brooks is here for eval of her heavy cycles and uterine fibroids. Fibroids noted on CT scan this past week Pt reports heavy cycles for several years Last 7 days Heavy for 3-5 days, cramps and clots Changes pad q 2 hrs on heavy days  H/O chronic anemia   PHM as document  Last pap 2016 negative  SAB x 1, EAB x 1, TSVD x 1 ( 6 #)  Sexual active without problems  PE AF VSS Lungs clear Heart RRR Abd soft + BS   A/P Menorrhagia with regular cycles        Uterine fibroids        Anemia, chronic suspect secondary to above  Discussed Dx with pt. Tx options reviewed with pt. Pt desires definite therapy. Will check GYN U/S Return after U/S for pelvic exam and pap smear

## 2018-01-12 ENCOUNTER — Ambulatory Visit (HOSPITAL_COMMUNITY)
Admission: RE | Admit: 2018-01-12 | Discharge: 2018-01-12 | Disposition: A | Discharge status: Home / Self Care | Payer: BLUE CROSS/BLUE SHIELD | Source: Ambulatory Visit | Attending: Obstetrics and Gynecology | Admitting: Obstetrics and Gynecology

## 2018-01-12 DIAGNOSIS — N920 Excessive and frequent menstruation with regular cycle: Secondary | ICD-10-CM | POA: Insufficient documentation

## 2018-01-12 DIAGNOSIS — D219 Benign neoplasm of connective and other soft tissue, unspecified: Secondary | ICD-10-CM | POA: Diagnosis present

## 2018-01-22 ENCOUNTER — Ambulatory Visit: Payer: BLUE CROSS/BLUE SHIELD | Admitting: Obstetrics and Gynecology

## 2018-02-02 ENCOUNTER — Encounter: Payer: Self-pay | Admitting: Obstetrics and Gynecology

## 2018-02-02 ENCOUNTER — Ambulatory Visit (INDEPENDENT_AMBULATORY_CARE_PROVIDER_SITE_OTHER): Payer: BLUE CROSS/BLUE SHIELD | Admitting: Obstetrics and Gynecology

## 2018-02-02 ENCOUNTER — Telehealth: Payer: Self-pay | Admitting: *Deleted

## 2018-02-02 VITALS — BP 140/86 | HR 85 | Ht 65.0 in | Wt 191.5 lb

## 2018-02-02 DIAGNOSIS — D219 Benign neoplasm of connective and other soft tissue, unspecified: Secondary | ICD-10-CM

## 2018-02-02 DIAGNOSIS — D500 Iron deficiency anemia secondary to blood loss (chronic): Secondary | ICD-10-CM

## 2018-02-02 DIAGNOSIS — N920 Excessive and frequent menstruation with regular cycle: Secondary | ICD-10-CM

## 2018-02-02 NOTE — Telephone Encounter (Signed)
Pt left message @ 1630 stating that she had seen Dr. Rip Harbour in office earlier today and is now having very bad stomach pain. She requested a call back.  1950 I called pt and her daughter answered. She provided alternate phone # [phone]. I was able to reach pt @ this number and discussed her concern. She stated that ever since her visit and exam this morning she had been having a lot of abdominal pain and had to leave work. She stated that she has a hernia and when the doctor did the exam and pushed on it "I think it went up in there". She found 1 tablet of Hydrocodone/acetaminophen from a previous ED visit and that has dropped the pain from 10/10 to 7/10. I advised pt that if her pain does not subside further, she should go to ED for evaluation. Pt voiced understanding.

## 2018-02-02 NOTE — Progress Notes (Signed)
Angela Brooks presents for f/u after U/S U/S revealed several fibroids 2-3 cm in size. Cycles as described in last visit. Chronic anemia thought to be related to her heavy cycles  U/S results reviewed with pt  Pt has tried OCP's and IUD in the past without satisfactory results.  H/O HTN, followed by PCP  PE AF  VSS Lungs clear Heart RRR Abd soft + BS  GU Nl EGBUS uterus 10 weeks mobile no adnexal masses  A/P Uterine fibroids/heavy cycles        Chronic amenia d/t to # 1  Pt desires definite therapy. TVH/BS reviewed with pt. R/B/Post -OP care reviewed. Pt verbalized understanding Information provided to pt. Will schedule for first available  F/U with Post - OP appt.

## 2018-02-02 NOTE — Patient Instructions (Signed)
Vaginal Hysterectomy, Care After Refer to this sheet in the next few weeks. These instructions provide you with information about caring for yourself after your procedure. Your health care provider may also give you more specific instructions. Your treatment has been planned according to current medical practices, but problems sometimes occur. Call your health care provider if you have any problems or questions after your procedure. What can I expect after the procedure? After the procedure, it is common to have:  Pain.  Soreness and numbness in your incision areas.  Vaginal bleeding and discharge.  Constipation.  Temporary problems emptying the bladder.  Feelings of sadness or other emotions. Follow these instructions at home: Medicines  Take over-the-counter and prescription medicines only as told by your health care provider.  If you were prescribed an antibiotic medicine, take it as told by your health care provider. Do not stop taking the antibiotic even if you start to feel better.  Do not drive or operate heavy machinery while taking prescription pain medicine. Activity  Return to your normal activities as told by your health care provider. Ask your health care provider what activities are safe for you.  Get regular exercise as told by your health care provider. You may be told to take short walks every day and go farther each time.  Do not lift anything that is heavier than 10 lb (4.5 kg). General instructions   Do not put anything in your vagina for 6 weeks after your surgery or as told by your health care provider. This includes tampons and douches.  Do not have sex until your health care provider says you can.  Do not take baths, swim, or use a hot tub until your health care provider approves.  Drink enough fluid to keep your urine clear or pale yellow.  Do not drive for 24 hours if you were given a sedative.  Keep all follow-up visits as told by your health  care provider. This is important. Contact a health care provider if:  Your pain medicine is not helping.  You have a fever.  You have redness, swelling, or pain at your incision site.  You have blood, pus, or a bad-smelling discharge from your vagina.  You continue to have difficulty urinating. Get help right away if:  You have severe abdominal or back pain.  You have heavy bleeding from your vagina.  You have chest pain or shortness of breath. This information is not intended to replace advice given to you by your health care provider. Make sure you discuss any questions you have with your health care provider. Document Released: 04/17/2015 Document Revised: 06/01/2015 Document Reviewed: 01/08/2015 Elsevier Interactive Patient Education  2019 Elsevier Inc. Vaginal Hysterectomy  A vaginal hysterectomy is a procedure to remove all or part of the uterus through a small incision in the vagina. In this procedure, your health care provider may remove your entire uterus, including the lower end (cervix). You may need a vaginal hysterectomy to treat:  Uterine fibroids.  A condition that causes the lining of the uterus to grow in other areas (endometriosis).  Problems with pelvic support.  Cancer of the cervix, ovaries, uterus, or tissue that lines the uterus (endometrium).  Excessive (dysfunctional) uterine bleeding. When removing your uterus, your health care provider may also remove the organs that produce eggs (ovaries) and the tubes that carry eggs to your uterus (fallopian tubes). After a vaginal hysterectomy, you will no longer be able to have a baby. You will also   no longer get your menstrual period. Tell a health care provider about:  Any allergies you have.  All medicines you are taking, including vitamins, herbs, eye drops, creams, and over-the-counter medicines.  Any problems you or family members have had with anesthetic medicines.  Any blood disorders you have.   Any surgeries you have had.  Any medical conditions you have.  Whether you are pregnant or may be pregnant. What are the risks? Generally, this is a safe procedure. However, problems may occur, including:  Bleeding.  Infection.  A blood clot that forms in your leg and travels to your lungs (pulmonary embolism).  Damage to surrounding organs.  Pain during sex. What happens before the procedure?  Ask your health care provider what organs will be removed during surgery.  Ask your health care provider about: ? Changing or stopping your regular medicines. This is especially important if you are taking diabetes medicines or blood thinners. ? Taking medicines such as aspirin and ibuprofen. These medicines can thin your blood. Do not take these medicines before your procedure if your health care provider instructs you not to.  Follow instructions from your health care provider about eating or drinking restrictions.  Do not use any tobacco products, such as cigarettes, chewing tobacco, and e-cigarettes. If you need help quitting, ask your health care provider.  Plan to have someone take you home after discharge from the hospital. What happens during the procedure?  To reduce your risk of infection: ? Your health care team will wash or sanitize their hands. ? Your skin will be washed with soap.  An IV tube will be inserted into one of your veins.  You may be given antibiotic medicine to help prevent infection.  You will be given one or more of the following: ? A medicine to help you relax (sedative). ? A medicine to numb the area (local anesthetic). ? A medicine to make you fall asleep (general anesthetic). ? A medicine that is injected into an area of your body to numb everything beyond the injection site (regional anesthetic).  Your surgeon will make an incision in your vagina.  Your surgeon will locate and remove all or part of your uterus.  Your ovaries and fallopian tubes  may be removed at the same time.  The incision will be closed with stitches (sutures) that dissolve over time. The procedure may vary among health care providers and hospitals. What happens after the procedure?  Your blood pressure, heart rate, breathing rate, and blood oxygen level will be monitored often until the medicines you were given have worn off.  You will be encouraged to get up and walk around after a few hours to help prevent complications.  You may have IV tubes in place for a few days.  You will be given pain medicine as needed.  Do not drive for 24 hours if you were given a sedative. This information is not intended to replace advice given to you by your health care provider. Make sure you discuss any questions you have with your health care provider. Document Released: 04/17/2015 Document Revised: 06/01/2015 Document Reviewed: 01/08/2015 Elsevier Interactive Patient Education  2019 Elsevier Inc.  

## 2018-02-05 ENCOUNTER — Encounter (HOSPITAL_COMMUNITY): Payer: Self-pay

## 2018-02-09 ENCOUNTER — Telehealth: Payer: Self-pay | Admitting: General Practice

## 2018-02-09 ENCOUNTER — Encounter: Payer: Self-pay | Admitting: *Deleted

## 2018-02-09 NOTE — Telephone Encounter (Signed)
Called patient and discussed need to come in and sign statement form. Patient verbalized understanding & states she will come by today. Patient had no questions.

## 2018-02-09 NOTE — Telephone Encounter (Signed)
-----   Message from Francia Greaves sent at 02/05/2018 11:25 AM EST ----- Regarding: Needs Hysterectomy Statement This patient needs a hysterectomy statement signed, she has medicaid secondary. Her surgery is scheduled for 03/31. Please have her come in and sign a hysterectomy statement.

## 2018-04-01 ENCOUNTER — Inpatient Hospital Stay (HOSPITAL_COMMUNITY): Admission: RE | Admit: 2018-04-01 | Payer: Medicaid Other | Source: Ambulatory Visit

## 2018-06-15 ENCOUNTER — Encounter: Payer: Self-pay | Admitting: Obstetrics and Gynecology

## 2018-06-15 ENCOUNTER — Telehealth (INDEPENDENT_AMBULATORY_CARE_PROVIDER_SITE_OTHER): Payer: BC Managed Care – PPO | Admitting: Obstetrics and Gynecology

## 2018-06-15 DIAGNOSIS — D500 Iron deficiency anemia secondary to blood loss (chronic): Secondary | ICD-10-CM

## 2018-06-15 DIAGNOSIS — N920 Excessive and frequent menstruation with regular cycle: Secondary | ICD-10-CM

## 2018-06-15 DIAGNOSIS — D219 Benign neoplasm of connective and other soft tissue, unspecified: Secondary | ICD-10-CM

## 2018-06-15 NOTE — Progress Notes (Signed)
   TELEHEALTH VIRTUAL GYNECOLOGY VISIT ENCOUNTER NOTE  I connected with Hoyt Koch on 06/15/18 at 11:15 AM EDT by telephone at home and verified that I am speaking with the correct person using two identifiers.   I discussed the limitations, risks, security and privacy concerns of performing an evaluation and management service by telephone and the availability of in person appointments. I also discussed with the patient that there may be a patient responsible charge related to this service. The patient expressed understanding and agreed to proceed.   History:  Angela Brooks is a 48 y.o. [id number] female being evaluated today for pre op eval for TVH/BS. She was unable to be seen d/t feeling hot and sweats this morning. See previous office visit notes. .       Past Medical History:  Diagnosis Date  . Allergy   . Anxiety   . Depression   . GERD (gastroesophageal reflux disease)   . Hypertension    Stopped taking meds x 1 year ago   Past Surgical History:  Procedure Laterality Date  . CHOLECYSTECTOMY     The following portions of the patient's history were reviewed and updated as appropriate: allergies, current medications, past family history, past medical history, past social history, past surgical history and problem list.   Health Maintenance:  Last pap 04/2014 normal  Review of Systems:  Pertinent items noted in HPI and remainder of comprehensive ROS otherwise negative.  Physical Exam:   General:  Alert, oriented and cooperative.   Mental Status: Normal mood and affect perceived. Normal judgment and thought content.  Physical exam deferred due to nature of the encounter  Labs and Imaging No results found for this or any previous visit (from the past 336 hour(s)). No results found.    Assessment and Plan:     1. Menorrhagia with regular cycle   2. Fibroids   3. Iron deficiency anemia due to chronic blood loss       Will see pt on Monday for pap and pelvic exam.   I discussed the assessment and treatment plan with the patient. The patient was provided an opportunity to ask questions and all were answered. The patient agreed with the plan and demonstrated an understanding of the instructions.   The patient was advised to call back or seek an in-person evaluation/go to the ED if the symptoms worsen or if the condition fails to improve as anticipated.  I provided 8 minutes of non-face-to-face time during this encounter.   Chancy Milroy, MD [facility], [place]

## 2018-06-17 ENCOUNTER — Other Ambulatory Visit: Payer: Self-pay

## 2018-06-17 ENCOUNTER — Encounter (HOSPITAL_BASED_OUTPATIENT_CLINIC_OR_DEPARTMENT_OTHER): Payer: Self-pay | Admitting: *Deleted

## 2018-06-17 ENCOUNTER — Telehealth: Payer: Self-pay | Admitting: Family Medicine

## 2018-06-17 NOTE — Telephone Encounter (Signed)
Patient is aware of upcoming appt, she has been instructed to wear a face mask, she is aware no visitors are allowed at this time due to [place],  Patient does not have any symptoms.

## 2018-06-20 ENCOUNTER — Other Ambulatory Visit (HOSPITAL_COMMUNITY)
Admission: RE | Admit: 2018-06-20 | Discharge: 2018-06-20 | Disposition: A | Discharge status: Home / Self Care | Payer: BC Managed Care – PPO | Source: Ambulatory Visit | Attending: Obstetrics and Gynecology | Admitting: Obstetrics and Gynecology

## 2018-06-20 DIAGNOSIS — Z1159 Encounter for screening for other viral diseases: Secondary | ICD-10-CM | POA: Insufficient documentation

## 2018-06-22 ENCOUNTER — Telehealth (INDEPENDENT_AMBULATORY_CARE_PROVIDER_SITE_OTHER): Payer: BC Managed Care – PPO | Admitting: Lactation Services

## 2018-06-22 ENCOUNTER — Ambulatory Visit: Payer: BC Managed Care – PPO | Admitting: Obstetrics and Gynecology

## 2018-06-22 ENCOUNTER — Encounter (HOSPITAL_COMMUNITY): Payer: Self-pay | Admitting: Lactation Services

## 2018-06-22 ENCOUNTER — Encounter: Payer: Self-pay | Admitting: General Practice

## 2018-06-22 ENCOUNTER — Telehealth: Payer: Self-pay | Admitting: *Deleted

## 2018-06-22 DIAGNOSIS — U071 COVID-19: Secondary | ICD-10-CM

## 2018-06-22 LAB — NOVEL CORONAVIRUS, NAA (HOSP ORDER, SEND-OUT TO REF LAB; TAT 18-24 HRS): SARS-CoV-2, NAA: DETECTED — AB

## 2018-06-22 NOTE — Progress Notes (Signed)
Pre-op COVID test results resulted. Patient is positive. Message left on Dr Rip Harbour VM of result and need to reschedule case and call back to confirm receipt. Call placed to Amedeo Plenty RN at [facility] to also notify the facility of result and to expect case to be rescheduled. Call placed to [facility] to also notify them of result.

## 2018-06-22 NOTE — Telephone Encounter (Signed)
Called pt to inform her she needs to follow up with her PCP for ongoing care in regards to her Covid 19 positive status. Pt voiced understanding.

## 2018-06-22 NOTE — Telephone Encounter (Signed)
Pt came to clinic unaware that she tested positive for Covid-19.  Pt informed and instructed to go home and self isolate.  Pt verbalized understanding.  Pt requested a note for work.  Advised pt that one could be sent via mychart and that we could send it through [place] or mail it to her but the pt could not remain in the office any longer.  Pt verbalized understanding but stated that she did not have a mychart.  Asked pt's cell number and sent pt home then, with this documentation, sent pt link to sign up for mychart.

## 2018-06-23 ENCOUNTER — Telehealth: Payer: BLUE CROSS/BLUE SHIELD | Admitting: Family Medicine

## 2018-06-24 ENCOUNTER — Other Ambulatory Visit: Payer: Self-pay

## 2018-06-24 ENCOUNTER — Telehealth (INDEPENDENT_AMBULATORY_CARE_PROVIDER_SITE_OTHER): Payer: BC Managed Care – PPO | Admitting: Family Medicine

## 2018-06-24 ENCOUNTER — Ambulatory Visit (HOSPITAL_BASED_OUTPATIENT_CLINIC_OR_DEPARTMENT_OTHER)
Admission: RE | Admit: 2018-06-24 | Payer: BC Managed Care – PPO | Source: Home / Self Care | Admitting: Obstetrics and Gynecology

## 2018-06-24 VITALS — Ht 65.0 in | Wt 175.0 lb

## 2018-06-24 DIAGNOSIS — U071 COVID-19: Secondary | ICD-10-CM

## 2018-06-24 SURGERY — HYSTERECTOMY, VAGINAL
Anesthesia: Choice | Laterality: Bilateral

## 2018-06-24 NOTE — Progress Notes (Signed)
Telemedicine Encounter- SOAP NOTE Established Patient  I discussed the limitations, risks, security and privacy concerns of performing an evaluation and management service by telephone and the availability of in person appointments. I also discussed with the patient that there may be a patient responsible charge related to this service. The patient expressed understanding and agreed to proceed.  This telephone encounter was conducted with the patient's (or proxy's) verbal consent via audio telecommunications: yes Patient was instructed to have this encounter in a suitably private space; and to only have persons present to whom they give permission to participate. In addition, patient identity was confirmed by use of name plus two identifiers (DOB and address).  I spent a total of 44min talking with the patient  Chief Complaint: COVID 19 Positive    Pt states that she only had hot flashes at night and no other symptoms. But now pt has no smell or taste  Pt states that she do not have a thermometer.  Pt states that her partner also tested positive. Pt would like to know what she should do about the kids that live in the home ages 27,16,17,and 19.            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 Testing for pre-op completed Sat 6/13 with results positive on Monday 6/15. Pt scheduled for GYN.  Pt had no symptoms prior to testing except night sweats one night. pts partner is positve for COVID19-tested Monday. Another coworker tested positive on Tuesday.    Subjective   Angela Brooks is a 48 y.o. female established patient. Telephone visit today for COVID 19 + on pre operative testing. Pt needs documentation for note -workplace requires 14 days from date of notification of test.   Patient Active Problem List   Diagnosis Date Noted  . Fibroids 01/01/2018  . Menorrhagia with regular cycle 01/01/2018  . 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 Iron deficiency  anemia due to chronic blood loss 04/17/2017  . Grief 09/28/2014  . Essential hypertension 09/28/2014  . Dental caries extending into pulp 02/06/2014  . GERD (gastroesophageal reflux disease) 05/14/2011    Past Medical History:  Diagnosis Date  . Allergy   . Anxiety   . Depression   . GERD (gastroesophageal reflux disease)   . Hypertension    Stopped taking meds x 1 year ago    Current Outpatient Medications  Medication Sig Dispense Refill  . ALPRAZolam (XANAX) 0.5 MG tablet Take 1 tablet (0.5 mg total) by mouth 3 (three) times daily as needed for anxiety. 90 tablet 1  . Multiple Vitamin (MULTIVITAMIN WITH MINERALS) TABS tablet Take 2 tablets by mouth daily.     No current facility-administered medications for this visit.     No Known Allergies  Social History   Socioeconomic History  . Marital status: Single    Spouse name: Not on file  . Number of children: Not on file  . Years of education: Not on file  . Highest education level: Not on file  Occupational History  . Not on file  Social Needs  . Financial resource strain: Not on file  . Food insecurity    Worry: Not on file    Inability: Not on file  . Transportation needs    Medical: Not on file    Non-medical: Not on file  Tobacco Use  . Smoking status: Never Smoker  . Smokeless tobacco: Never Used  Substance and Sexual Activity  . Alcohol  use: No    Alcohol/week: 0.0 standard drinks  . Drug use: No  . Sexual activity: Yes    Birth control/protection: None  Lifestyle  . Physical activity    Days per week: Not on file    Minutes per session: Not on file  . Stress: Not on file  Relationships  . Social Herbalist on phone: Not on file    Gets together: Not on file    Attends religious service: Not on file    Active member of club or organization: Not on file    Attends meetings of clubs or organizations: Not on file    Relationship status: Not on file  . Intimate partner violence    Fear  of current or ex partner: Not on file    Emotionally abused: Not on file    Physically abused: Not on file    Forced sexual activity: Not on file  Other Topics Concern  . Not on file  Social History Narrative  . Not on file    Review of Systems  Constitutional: Negative for chills and fever.  HENT: Positive for congestion.   Respiratory: Positive for cough. Negative for shortness of breath.   Cardiovascular: Positive for palpitations.  Gastrointestinal: Negative for abdominal pain and diarrhea.   Taking sudafed for congestion Cough today, first day of symptoms Currently  pt with no taste and no swell Pt found a thermometer and has no temperature  Vitals as reported by the patient: Today's Vitals   06/24/18 1701  Weight: 175 lb (79.4 kg)  Height: 5\' 5"  (1.651 m)   1. COVID-19 virus detected Pt will be out of work for 14 days-note placed in [place]. Avoid contact as much as possible. Partner + for COVID  I discussed the assessment and treatment plan with the patient. The patient was provided an opportunity to ask questions and all were answered. The patient agreed with the plan and demonstrated an understanding of the instructions.   The patient was advised to call back or seek an in-person evaluation if the symptoms worsen or if the condition fails to improve as anticipated.  I provided 15 minutes of non-face-to-face time during this encounter.   Hannah Beat, MD  Primary [facility] at [facility] 06-24-18

## 2018-06-24 NOTE — Progress Notes (Signed)
Chief Complaint: COVID 19 Positive    Pt states that she only had hot flashes at night and no other symptoms. But now pt has no smell or taste  Pt states that she do not have a thermometer.  Pt states that her partner also tested positive. Pt would like to know what she should do about the kids that live in the home ages 26,16,17,and 19.

## 2018-06-25 DIAGNOSIS — U071 COVID-19: Secondary | ICD-10-CM | POA: Insufficient documentation

## 2018-07-22 ENCOUNTER — Encounter (HOSPITAL_BASED_OUTPATIENT_CLINIC_OR_DEPARTMENT_OTHER): Payer: Self-pay | Admitting: *Deleted

## 2018-07-22 NOTE — Progress Notes (Signed)
Pre op call done- PAT appt scheduled for 07/28/18. Will not need  additional covid screening per current policy, reviewed with Amedeo Plenty RN.

## 2018-07-25 ENCOUNTER — Other Ambulatory Visit (HOSPITAL_COMMUNITY): Payer: BC Managed Care – PPO | Attending: Obstetrics and Gynecology

## 2018-07-27 ENCOUNTER — Telehealth: Payer: Self-pay

## 2018-07-27 ENCOUNTER — Encounter (HOSPITAL_BASED_OUTPATIENT_CLINIC_OR_DEPARTMENT_OTHER)
Admission: RE | Admit: 2018-07-27 | Discharge: 2018-07-27 | Disposition: A | Discharge status: Home / Self Care | Payer: BC Managed Care – PPO | Source: Ambulatory Visit | Attending: Obstetrics and Gynecology | Admitting: Obstetrics and Gynecology

## 2018-07-27 DIAGNOSIS — Z01812 Encounter for preprocedural laboratory examination: Secondary | ICD-10-CM | POA: Insufficient documentation

## 2018-07-27 LAB — CBC
HCT: 27.1 % — ABNORMAL LOW (ref 36.0–46.0)
Hemoglobin: 7.2 g/dL — ABNORMAL LOW (ref 12.0–15.0)
MCH: 16.6 pg — ABNORMAL LOW (ref 26.0–34.0)
MCHC: 26.6 g/dL — ABNORMAL LOW (ref 30.0–36.0)
MCV: 62.3 fL — ABNORMAL LOW (ref 80.0–100.0)
Platelets: 186 10*3/uL (ref 150–400)
RBC: 4.35 MIL/uL (ref 3.87–5.11)
RDW: 20.1 % — ABNORMAL HIGH (ref 11.5–15.5)
WBC: 6.1 10*3/uL (ref 4.0–10.5)
nRBC: 0 % (ref 0.0–0.2)

## 2018-07-27 LAB — BASIC METABOLIC PANEL
Anion gap: 8 (ref 5–15)
BUN: 12 mg/dL (ref 6–20)
CO2: 25 mmol/L (ref 22–32)
Calcium: 8.9 mg/dL (ref 8.9–10.3)
Chloride: 104 mmol/L (ref 98–111)
Creatinine, Ser: 0.97 mg/dL (ref 0.44–1.00)
GFR calc Af Amer: >60 mL/min (ref >60)
GFR calc non Af Amer: >60 mL/min (ref >60)
Glucose, Bld: 100 mg/dL — ABNORMAL HIGH (ref 70–99)
Potassium: 3.9 mmol/L (ref 3.5–5.1)
Sodium: 137 mmol/L (ref 135–145)

## 2018-07-27 LAB — POCT PREGNANCY, URINE: Preg Test, Ur: NEGATIVE

## 2018-07-27 NOTE — Progress Notes (Signed)
Ensure Pre-Surgery drink given with instructions to complete by 0400 DOS.  Surgical soap also given with instructions for use.  Patient verbalized understanding of instructions.

## 2018-07-27 NOTE — Telephone Encounter (Signed)
Called and spoke w/ Angela Brooks about her surgery changing facilities due to low hemoglobin. Patient expressed understanding, advise to call back should any question arise.

## 2018-07-27 NOTE — Progress Notes (Signed)
Abnormal CBC results called to Dr. Marjory Lies office, message left.  Abnormal labs also reviewed by Dr. Ambrose Pancoast; ok to proceed.

## 2018-07-28 ENCOUNTER — Encounter (HOSPITAL_COMMUNITY): Payer: Self-pay | Admitting: *Deleted

## 2018-07-28 ENCOUNTER — Other Ambulatory Visit: Payer: Self-pay

## 2018-07-28 ENCOUNTER — Inpatient Hospital Stay (HOSPITAL_COMMUNITY): Admission: RE | Admit: 2018-07-28 | Payer: BC Managed Care – PPO | Source: Ambulatory Visit

## 2018-07-28 NOTE — Anesthesia Preprocedure Evaluation (Addendum)
Anesthesia Evaluation  Patient identified by MRN, date of birth, ID band Patient awake    Reviewed: Allergy & Precautions, NPO status , Patient's Chart, lab work & pertinent test results  Airway Mallampati: II  TM Distance: >3 FB Neck ROM: Full    Dental no notable dental hx. (+) Teeth Intact   Pulmonary  Covid-19 + on 06/20/2018 asymptomatic   Pulmonary exam normal breath sounds clear to auscultation       Cardiovascular hypertension, Normal cardiovascular exam Rhythm:Regular Rate:Normal  Not currently on any Rx   Neuro/Psych PSYCHIATRIC DISORDERS Anxiety Depression negative neurological ROS     GI/Hepatic Neg liver ROS, GERD  Medicated,  Endo/Other  negative endocrine ROS  Renal/GU negative Renal ROS  negative genitourinary   Musculoskeletal negative musculoskeletal ROS (+)   Abdominal   Peds  Hematology  (+) anemia ,   Anesthesia Other Findings   Reproductive/Obstetrics negative OB ROS Uterine fibroids                            Anesthesia Physical Anesthesia Plan  ASA: II  Anesthesia Plan: General   Post-op Pain Management:    Induction: Intravenous  PONV Risk Score and Plan: 4 or greater and Scopolamine patch - Pre-op, Midazolam, Ondansetron, Dexamethasone and Treatment may vary due to age or medical condition  Airway Management Planned: Oral ETT  Additional Equipment:   Intra-op Plan:   Post-operative Plan: Extubation in OR  Informed Consent: I have reviewed the patients History and Physical, chart, labs and discussed the procedure including the risks, benefits and alternatives for the proposed anesthesia with the patient or authorized representative who has indicated his/her understanding and acceptance.     Dental advisory given  Plan Discussed with: CRNA and Surgeon  Anesthesia Plan Comments:        Anesthesia Quick Evaluation

## 2018-07-28 NOTE — Progress Notes (Signed)
Spoke with pt for pre-op call. Pt has hx of HTN, no longer treated with medications. Pt states she tested positive for Covid 19 on 06/20/18. Pt will not be retested prior to surgery per Particia Lather, RN, Assistant Director.  Pt had blood work done at [facility] yesterday and surgery was moved to the main OR due to a low Hemoglobin. Pt had a type and screen done yesterday and she has her blood bank arm bracelet on.    Coronavirus Screening  Have you experienced the following symptoms:  Cough NO Fever (>100.69F)  NO Runny nose NO Sore throat NO Difficulty breathing/shortness of breath  NO  Have you or a family member traveled in the last 14 days and where? NO    Patient reminded that hospital visitation restrictions are in effect and the importance of the restrictions.

## 2018-07-29 ENCOUNTER — Encounter (HOSPITAL_COMMUNITY): Payer: Self-pay

## 2018-07-29 ENCOUNTER — Ambulatory Visit (HOSPITAL_COMMUNITY): Payer: BC Managed Care – PPO | Admitting: Anesthesiology

## 2018-07-29 ENCOUNTER — Encounter (HOSPITAL_COMMUNITY)
Admission: RE | Disposition: A | Discharge status: Home / Self Care | Payer: Self-pay | Source: Home / Self Care | Attending: Obstetrics and Gynecology

## 2018-07-29 ENCOUNTER — Observation Stay (HOSPITAL_COMMUNITY)
Admission: RE | Admit: 2018-07-29 | Discharge: 2018-07-30 | Disposition: A | Discharge status: Home / Self Care | Payer: BC Managed Care – PPO | Attending: Obstetrics and Gynecology | Admitting: Obstetrics and Gynecology

## 2018-07-29 ENCOUNTER — Other Ambulatory Visit: Payer: Self-pay

## 2018-07-29 DIAGNOSIS — Z9889 Other specified postprocedural states: Secondary | ICD-10-CM

## 2018-07-29 DIAGNOSIS — N800 Endometriosis of uterus: Secondary | ICD-10-CM | POA: Diagnosis not present

## 2018-07-29 DIAGNOSIS — D649 Anemia, unspecified: Secondary | ICD-10-CM | POA: Diagnosis not present

## 2018-07-29 DIAGNOSIS — D259 Leiomyoma of uterus, unspecified: Secondary | ICD-10-CM | POA: Diagnosis not present

## 2018-07-29 DIAGNOSIS — Z79899 Other long term (current) drug therapy: Secondary | ICD-10-CM | POA: Diagnosis not present

## 2018-07-29 DIAGNOSIS — N920 Excessive and frequent menstruation with regular cycle: Secondary | ICD-10-CM | POA: Insufficient documentation

## 2018-07-29 DIAGNOSIS — U071 COVID-19: Secondary | ICD-10-CM | POA: Insufficient documentation

## 2018-07-29 DIAGNOSIS — I10 Essential (primary) hypertension: Secondary | ICD-10-CM | POA: Diagnosis not present

## 2018-07-29 DIAGNOSIS — D251 Intramural leiomyoma of uterus: Secondary | ICD-10-CM | POA: Diagnosis not present

## 2018-07-29 DIAGNOSIS — F419 Anxiety disorder, unspecified: Secondary | ICD-10-CM | POA: Diagnosis not present

## 2018-07-29 HISTORY — PX: ABDOMINAL HYSTERECTOMY: SHX81

## 2018-07-29 HISTORY — DX: Anemia, unspecified: D64.9

## 2018-07-29 HISTORY — PX: VAGINAL HYSTERECTOMY: SHX2639

## 2018-07-29 LAB — POCT PREGNANCY, URINE: Preg Test, Ur: NEGATIVE

## 2018-07-29 LAB — SARS CORONAVIRUS 2 BY RT PCR (HOSPITAL ORDER, PERFORMED IN ~~LOC~~ HOSPITAL LAB): SARS Coronavirus 2: POSITIVE — AB

## 2018-07-29 LAB — PREPARE RBC (CROSSMATCH)

## 2018-07-29 SURGERY — HYSTERECTOMY, VAGINAL
Anesthesia: General | Site: Vagina | Laterality: Right

## 2018-07-29 MED ORDER — LIDOCAINE 2% (20 MG/ML) 5 ML SYRINGE
INTRAMUSCULAR | Status: AC
  Filled 2018-07-29: qty 5

## 2018-07-29 MED ORDER — HYDROMORPHONE HCL 1 MG/ML IJ SOLN
0.2500 mg | INTRAMUSCULAR | Status: DC | PRN
  Administered 2018-07-29 (×4): 0.5 mg via INTRAVENOUS

## 2018-07-29 MED ORDER — SODIUM CHLORIDE 0.9 % IV SOLN
INTRAVENOUS | Status: DC | PRN
  Administered 2018-07-29: 09:00:00 via INTRAVENOUS

## 2018-07-29 MED ORDER — SENNA 8.6 MG PO TABS
1.0000 | ORAL_TABLET | Freq: Two times a day (BID) | ORAL | Status: DC
  Administered 2018-07-29 – 2018-07-30 (×3): 8.6 mg via ORAL
  Filled 2018-07-29 (×3): qty 1

## 2018-07-29 MED ORDER — HYDROMORPHONE HCL 1 MG/ML IJ SOLN
INTRAMUSCULAR | Status: AC
  Filled 2018-07-29: qty 1

## 2018-07-29 MED ORDER — ONDANSETRON HCL 4 MG PO TABS: 4.0000 mg | ORAL_TABLET | Freq: Four times a day (QID) | ORAL | Status: DC | PRN

## 2018-07-29 MED ORDER — OXYCODONE-ACETAMINOPHEN 5-325 MG PO TABS
1.0000 | ORAL_TABLET | ORAL | Status: DC | PRN
  Administered 2018-07-30: 09:00:00 1 via ORAL
  Administered 2018-07-30: 2 via ORAL
  Filled 2018-07-29 (×3): qty 2

## 2018-07-29 MED ORDER — LIDOCAINE 2% (20 MG/ML) 5 ML SYRINGE
INTRAMUSCULAR | Status: DC | PRN
  Administered 2018-07-29: 100 mg via INTRAVENOUS

## 2018-07-29 MED ORDER — SUCCINYLCHOLINE CHLORIDE 20 MG/ML IJ SOLN
INTRAMUSCULAR | Status: DC | PRN
  Administered 2018-07-29: 120 mg via INTRAVENOUS

## 2018-07-29 MED ORDER — PROPOFOL 10 MG/ML IV BOLUS
INTRAVENOUS | Status: AC
  Filled 2018-07-29: qty 40

## 2018-07-29 MED ORDER — DEXAMETHASONE SODIUM PHOSPHATE 10 MG/ML IJ SOLN
INTRAMUSCULAR | Status: DC | PRN
  Administered 2018-07-29: 5 mg via INTRAVENOUS

## 2018-07-29 MED ORDER — 0.9 % SODIUM CHLORIDE (POUR BTL) OPTIME
TOPICAL | Status: DC | PRN
  Administered 2018-07-29: 08:00:00 1000 mL

## 2018-07-29 MED ORDER — MIDAZOLAM HCL 2 MG/2ML IJ SOLN
INTRAMUSCULAR | Status: AC
  Filled 2018-07-29: qty 2

## 2018-07-29 MED ORDER — LACTATED RINGERS IV SOLN
INTRAVENOUS | Status: DC
  Administered 2018-07-29 – 2018-07-30 (×2): via INTRAVENOUS

## 2018-07-29 MED ORDER — PHENYLEPHRINE 40 MCG/ML (10ML) SYRINGE FOR IV PUSH (FOR BLOOD PRESSURE SUPPORT)
PREFILLED_SYRINGE | INTRAVENOUS | Status: AC
  Filled 2018-07-29: qty 10

## 2018-07-29 MED ORDER — HYDROMORPHONE HCL 1 MG/ML IJ SOLN
2.0000 mg | INTRAMUSCULAR | Status: DC | PRN
  Administered 2018-07-29: 1 mg via INTRAVENOUS
  Administered 2018-07-29 – 2018-07-30 (×2): 2 mg via INTRAVENOUS
  Filled 2018-07-29 (×3): qty 2

## 2018-07-29 MED ORDER — FENTANYL CITRATE (PF) 100 MCG/2ML IJ SOLN
INTRAMUSCULAR | Status: DC | PRN
  Administered 2018-07-29: 150 ug via INTRAVENOUS
  Administered 2018-07-29 (×2): 50 ug via INTRAVENOUS

## 2018-07-29 MED ORDER — METOCLOPRAMIDE HCL 5 MG/ML IJ SOLN: 10.0000 mg | Freq: Once | INTRAMUSCULAR | Status: DC | PRN

## 2018-07-29 MED ORDER — MEPERIDINE HCL 25 MG/ML IJ SOLN: 6.2500 mg | INTRAMUSCULAR | Status: DC | PRN

## 2018-07-29 MED ORDER — ROCURONIUM BROMIDE 50 MG/5ML IV SOSY
PREFILLED_SYRINGE | INTRAVENOUS | Status: DC | PRN
  Administered 2018-07-29: 50 mg via INTRAVENOUS

## 2018-07-29 MED ORDER — OXYCODONE-ACETAMINOPHEN 5-325 MG PO TABS
ORAL_TABLET | ORAL | Status: AC
  Administered 2018-07-30: 1 via ORAL
  Filled 2018-07-29: qty 2

## 2018-07-29 MED ORDER — EPHEDRINE SULFATE 50 MG/ML IJ SOLN
INTRAMUSCULAR | Status: DC | PRN
  Administered 2018-07-29: 10 mg via INTRAVENOUS

## 2018-07-29 MED ORDER — ZOLPIDEM TARTRATE 5 MG PO TABS: 5.0000 mg | ORAL_TABLET | Freq: Every evening | ORAL | Status: DC | PRN

## 2018-07-29 MED ORDER — KETOROLAC TROMETHAMINE 15 MG/ML IJ SOLN
15.0000 mg | INTRAMUSCULAR | Status: AC
  Administered 2018-07-29: 07:00:00 15 mg via INTRAVENOUS
  Filled 2018-07-29: qty 1

## 2018-07-29 MED ORDER — LACTATED RINGERS IV SOLN
INTRAVENOUS | Status: DC
  Administered 2018-07-29: 07:00:00 via INTRAVENOUS

## 2018-07-29 MED ORDER — SODIUM CHLORIDE 0.9% IV SOLUTION: Freq: Once | INTRAVENOUS | Status: DC

## 2018-07-29 MED ORDER — SOD CITRATE-CITRIC ACID 500-334 MG/5ML PO SOLN
30.0000 mL | ORAL | Status: AC
  Administered 2018-07-29: 30 mL via ORAL
  Filled 2018-07-29: qty 30

## 2018-07-29 MED ORDER — ONDANSETRON HCL 4 MG/2ML IJ SOLN: 4.0000 mg | Freq: Four times a day (QID) | INTRAMUSCULAR | Status: DC | PRN

## 2018-07-29 MED ORDER — PHENYLEPHRINE HCL (PRESSORS) 10 MG/ML IV SOLN
INTRAVENOUS | Status: DC | PRN
  Administered 2018-07-29: 80 ug via INTRAVENOUS

## 2018-07-29 MED ORDER — FENTANYL CITRATE (PF) 250 MCG/5ML IJ SOLN
INTRAMUSCULAR | Status: AC
  Filled 2018-07-29: qty 5

## 2018-07-29 MED ORDER — SUCCINYLCHOLINE CHLORIDE 200 MG/10ML IV SOSY
PREFILLED_SYRINGE | INTRAVENOUS | Status: AC
  Filled 2018-07-29: qty 10

## 2018-07-29 MED ORDER — MIDAZOLAM HCL 5 MG/5ML IJ SOLN
INTRAMUSCULAR | Status: DC | PRN
  Administered 2018-07-29: 2 mg via INTRAVENOUS

## 2018-07-29 MED ORDER — CEFAZOLIN SODIUM-DEXTROSE 2-4 GM/100ML-% IV SOLN
2.0000 g | INTRAVENOUS | Status: AC
  Administered 2018-07-29: 2 g via INTRAVENOUS
  Filled 2018-07-29: qty 100

## 2018-07-29 MED ORDER — ONDANSETRON HCL 4 MG/2ML IJ SOLN
INTRAMUSCULAR | Status: AC
  Filled 2018-07-29: qty 2

## 2018-07-29 MED ORDER — EPHEDRINE 5 MG/ML INJ
INTRAVENOUS | Status: AC
  Filled 2018-07-29: qty 10

## 2018-07-29 MED ORDER — ONDANSETRON HCL 4 MG/2ML IJ SOLN
INTRAMUSCULAR | Status: DC | PRN
  Administered 2018-07-29: 4 mg via INTRAVENOUS

## 2018-07-29 MED ORDER — SUGAMMADEX SODIUM 200 MG/2ML IV SOLN
INTRAVENOUS | Status: DC | PRN
  Administered 2018-07-29: 200 mg via INTRAVENOUS

## 2018-07-29 MED ORDER — OXYCODONE-ACETAMINOPHEN 5-325 MG PO TABS
2.0000 | ORAL_TABLET | ORAL | Status: DC | PRN
  Administered 2018-07-29 (×3): 2 via ORAL
  Filled 2018-07-29: qty 2

## 2018-07-29 MED ORDER — SIMETHICONE 80 MG PO CHEW: 80.0000 mg | CHEWABLE_TABLET | Freq: Four times a day (QID) | ORAL | Status: DC | PRN

## 2018-07-29 MED ORDER — SCOPOLAMINE 1 MG/3DAYS TD PT72
1.0000 | MEDICATED_PATCH | TRANSDERMAL | Status: DC
  Administered 2018-07-29: 07:00:00 1.5 mg via TRANSDERMAL
  Filled 2018-07-29: qty 1

## 2018-07-29 MED ORDER — ROCURONIUM BROMIDE 10 MG/ML (PF) SYRINGE
PREFILLED_SYRINGE | INTRAVENOUS | Status: AC
  Filled 2018-07-29: qty 10

## 2018-07-29 MED ORDER — DEXAMETHASONE SODIUM PHOSPHATE 10 MG/ML IJ SOLN
INTRAMUSCULAR | Status: AC
  Filled 2018-07-29: qty 1

## 2018-07-29 MED ORDER — ALBUMIN HUMAN 5 % IV SOLN
INTRAVENOUS | Status: DC | PRN
  Administered 2018-07-29: 08:00:00 via INTRAVENOUS

## 2018-07-29 MED ORDER — GABAPENTIN 300 MG PO CAPS
300.0000 mg | ORAL_CAPSULE | ORAL | Status: AC
  Administered 2018-07-29: 07:00:00 300 mg via ORAL
  Filled 2018-07-29: qty 1

## 2018-07-29 MED ORDER — PROPOFOL 10 MG/ML IV BOLUS
INTRAVENOUS | Status: DC | PRN
  Administered 2018-07-29: 200 mg via INTRAVENOUS
  Administered 2018-07-29: 20 mg via INTRAVENOUS

## 2018-07-29 SURGICAL SUPPLY — 27 items
BLADE SURG 10 STRL SS (BLADE) ×2 IMPLANT
CANISTER SUCT 3000ML PPV (MISCELLANEOUS) ×2 IMPLANT
COVER WAND RF STERILE (DRAPES) ×2 IMPLANT
GAUZE 4X4 16PLY RFD (DISPOSABLE) ×2 IMPLANT
GLOVE BIO SURGEON STRL SZ7 (GLOVE) ×1 IMPLANT
GLOVE BIO SURGEON STRL SZ7.5 (GLOVE) ×2 IMPLANT
GLOVE BIOGEL PI IND STRL 6.5 (GLOVE) ×1 IMPLANT
GLOVE BIOGEL PI IND STRL 7.0 (GLOVE) ×2 IMPLANT
GLOVE BIOGEL PI INDICATOR 6.5 (GLOVE) ×1
GLOVE BIOGEL PI INDICATOR 7.0 (GLOVE) ×2
GLOVE INDICATOR 8.0 STRL GRN (GLOVE) ×2 IMPLANT
GOWN STRL REUS W/ TWL LRG LVL3 (GOWN DISPOSABLE) ×3 IMPLANT
GOWN STRL REUS W/ TWL XL LVL3 (GOWN DISPOSABLE) ×1 IMPLANT
GOWN STRL REUS W/TWL LRG LVL3 (GOWN DISPOSABLE) ×6
GOWN STRL REUS W/TWL XL LVL3 (GOWN DISPOSABLE) ×2
KIT TURNOVER KIT B (KITS) ×2 IMPLANT
NS IRRIG 1000ML POUR BTL (IV SOLUTION) ×2 IMPLANT
PACK VAGINAL WOMENS (CUSTOM PROCEDURE TRAY) ×2 IMPLANT
PAD OB MATERNITY 4.3X12.25 (PERSONAL CARE ITEMS) ×2 IMPLANT
SPECIMEN JAR MEDIUM (MISCELLANEOUS) IMPLANT
SUT VIC AB 2-0 CT1 18 (SUTURE) ×2 IMPLANT
SUT VIC AB 2-0 CT1 27 (SUTURE) ×2
SUT VIC AB 2-0 CT1 TAPERPNT 27 (SUTURE) ×1 IMPLANT
SUT VIC AB PLUS 45CM 1-MO-4 (SUTURE) ×6 IMPLANT
SUT VICRYL 1 TIES 12X18 (SUTURE) ×2 IMPLANT
TOWEL GREEN STERILE FF (TOWEL DISPOSABLE) ×4 IMPLANT
TRAY FOLEY W/BAG SLVR 14FR (SET/KITS/TRAYS/PACK) ×2 IMPLANT

## 2018-07-29 NOTE — Progress Notes (Addendum)
Patient positive for COVID- Called and left VM for Dr. Rip Harbour requesting transfer order to 2W. Awaiting return call.  Angela Brooks with Dr. Rip Harbour and received order to transfer to 2W.

## 2018-07-29 NOTE — Progress Notes (Addendum)
Per patient she has not been COVID test again since testing positive in early June. Patient states she has had a cough recently. Paged Dr. Rip Harbour to request an order for COVID test. Awaiting response.  78 Paged Dr. Rip Harbour requesting a COVID test order.

## 2018-07-29 NOTE — Progress Notes (Signed)
Received patient from PACU. Patient drowsy but easily alert. Peripad in place. Patient oriented to call bell and bed controls. Instructed patient not to self ambulate and to utilize call bell for assistance. Will continue to monitor.

## 2018-07-29 NOTE — Anesthesia Procedure Notes (Addendum)
Procedure Name: Intubation Date/Time: 07/29/2018 7:43 AM Performed by: Scheryl Darter, CRNA Pre-anesthesia Checklist: Patient identified, Emergency Drugs available, Suction available and Patient being monitored Patient Re-evaluated:Patient Re-evaluated prior to induction Oxygen Delivery Method: Circle System Utilized Preoxygenation: Pre-oxygenation with 100% oxygen Induction Type: IV induction and Rapid sequence Ventilation: Mask ventilation without difficulty Laryngoscope Size: Miller and 2 Grade View: Grade I Tube type: Oral Tube size: 7.0 mm Number of attempts: 1 Airway Equipment and Method: Stylet and Oral airway Placement Confirmation: ETT inserted through vocal cords under direct vision,  positive ETCO2 and breath sounds checked- equal and bilateral Secured at: 21 cm Tube secured with: Tape Dental Injury: Teeth and Oropharynx as per pre-operative assessment

## 2018-07-29 NOTE — Transfer of Care (Signed)
Immediate Anesthesia Transfer of Care Note  Patient: Angela Brooks  Procedure(s) Performed: HYSTERECTOMY VAGINAL Right  SALPINGECTOMY (Bilateral Vagina )  Patient Location: PACU  Anesthesia Type:General  Level of Consciousness: awake, alert , oriented and sedated  Airway & Oxygen Therapy: Patient Spontanous Breathing and Patient connected to nasal cannula oxygen  Post-op Assessment: Report given to RN, Post -op Vital signs reviewed and stable and Patient moving all extremities  Post vital signs: Reviewed and stable  Last Vitals:  Vitals Value Taken Time  BP 155/93 07/29/18 1000  Temp    Pulse 85 07/29/18 1003  Resp 20 07/29/18 1003  SpO2 94 % 07/29/18 1003  Vitals shown include unvalidated device data.  Last Pain:  Vitals:   07/29/18 1000  PainSc: 6       Patients Stated Pain Goal: 0 (69/62/95 2841)  Complications: No apparent anesthesia complications

## 2018-07-29 NOTE — H&P (Signed)
Angela Brooks is an 48 y.o. female [phone] with H/O chronic anemia and uterine fibroids.  Heavy cycles for several years, refractory to medical therapy. Cycles monthly, last 7 days.  Desires definite treatment  TSVD x 1 SAB x 1  EAB x 1   Menstrual History: Menarche age: 67 Patient's last menstrual period was 07/02/2018 (exact date).    Past Medical History:  Diagnosis Date  . Allergy   . Anemia   . Anxiety   . Depression   . GERD (gastroesophageal reflux disease)   . Hypertension    Stopped taking meds x 1 year ago    Past Surgical History:  Procedure Laterality Date  . CHOLECYSTECTOMY    . WISDOM TOOTH EXTRACTION      Family History  Problem Relation Age of Onset  . Hypertension Mother   . Diabetes Maternal Grandmother     Social History:  reports that she has never smoked. She has never used smokeless tobacco. She reports that she does not drink alcohol or use drugs.  Allergies: No Known Allergies  Medications Prior to Admission  Medication Sig Dispense Refill Last Dose  . acetaminophen (TYLENOL) 500 MG tablet Take 1,000 mg by mouth every 6 (six) hours as needed.   07/28/2018 at Unknown time  . Ascorbic Acid (VITAMIN C) 1000 MG tablet Take 1,000 mg by mouth daily.   07/28/2018 at Unknown time  . Multiple Vitamin (MULTIVITAMIN WITH MINERALS) TABS tablet Take 2 tablets by mouth daily.   07/28/2018 at Unknown time  . Nutritional Supplements (IMMUNE ENHANCE) TABS Take by mouth.   07/28/2018 at Unknown time  . ALPRAZolam (XANAX) 0.5 MG tablet Take 1 tablet (0.5 mg total) by mouth 3 (three) times daily as needed for anxiety. 90 tablet 1 More than a month at Unknown time    Review of Systems  Constitutional: Negative.   Respiratory: Negative.   Cardiovascular: Negative.   Gastrointestinal: Negative.     Blood pressure (!) 158/110, pulse 86, temperature 98 F (36.7 C), resp. rate 17, height 5\' 5"  (1.651 m), weight 88.5 kg, last menstrual period 07/02/2018. Physical  Exam  Constitutional: She appears well-developed and well-nourished.  Cardiovascular: Normal heart sounds.  Respiratory: Effort normal and breath sounds normal.  GI: Soft. Bowel sounds are normal.  Genitourinary:    Genitourinary Comments: Deferred to OR     Results for orders placed or performed during the hospital encounter of 07/29/18 (from the past 24 hour(s))  Pregnancy, urine POC     Status: None   Collection Time: 07/29/18  6:39 AM  Result Value Ref Range   Preg Test, Ur NEGATIVE NEGATIVE    No results found.  Assessment/Plan: Menorrhagia Uterine fibroids Chronic anemia secondary to above.  Pt desires definite therapy. TVH with BS discussed with pt. R/B/Post op care reviewed with pt. Pt desires to proceed. Agreeable to blood transfusion as necessary.   Chancy Milroy 07/29/2018, 7:14 AM

## 2018-07-29 NOTE — Anesthesia Postprocedure Evaluation (Signed)
Anesthesia Post Note  Patient: Angela Brooks  Procedure(s) Performed: HYSTERECTOMY VAGINAL Right  SALPINGECTOMY (Bilateral Vagina )     Patient location during evaluation: PACU Anesthesia Type: General Level of consciousness: awake and alert and oriented Pain management: pain level controlled Vital Signs Assessment: post-procedure vital signs reviewed and stable Respiratory status: spontaneous breathing, nonlabored ventilation, respiratory function stable and patient connected to nasal cannula oxygen Cardiovascular status: blood pressure returned to baseline and stable Postop Assessment: no apparent nausea or vomiting Anesthetic complications: no                  Ivoree Felmlee A.

## 2018-07-29 NOTE — Op Note (Signed)
Angela Brooks PROCEDURE DATE: 07/29/2018  PREOPERATIVE DIAGNOSIS:  Symptomatic fibroids, menorrhagia POSTOPERATIVE DIAGNOSIS:  Symptomatic fibroids, menorrhagia SURGEON:   Chancy Milroy M.D., FACOG ASSISTANT: Verita Schneiders, M.D. OPERATION:  Total Vaginal hysterectomy with right salpingectomy ANESTHESIA:  General endotracheal.  INDICATIONS: The patient is a 48 y.o. G1P0011 with history of symptomatic uterine fibroids/menorrhagia. The patient made a decision to undergo definite surgical treatment. On the preoperative visit, the risks, benefits, indications, and alternatives of the procedure were reviewed with the patient.  On the day of surgery, the risks of surgery were again discussed with the patient including but not limited to: bleeding which may require transfusion or reoperation; infection which may require antibiotics; injury to bowel, bladder, ureters or other surrounding organs; need for additional procedures; thromboembolic phenomenon, incisional problems and other postoperative/anesthesia complications. Written informed consent was obtained.    OPERATIVE FINDINGS: A 12 week size uterus with normal tubes and ovaries bilaterally.  ESTIMATED BLOOD LOSS: 200 ml FLUIDS:  900 ml of Lactated Ringers and 250 cc Albumin URINE OUTPUT:  200 ml of clear yellow urine. SPECIMENS:  Uterus and cervix sent to pathology COMPLICATIONS:  None immediate.  DESCRIPTION OF PROCEDURE:  The patient received intravenous antibiotics and had sequential compression devices applied to her lower extremities while in the preoperative area.  She was then taken to the operating room where general anesthesia was administered and was found to be adequate.  She was placed in the dorsal lithotomy position, and was prepped and draped in a sterile manner.  The patients bladder was drained and foley catheter was left indwelling. After an adequate timeout was performed, attention was turned to her pelvis.  A weighted speculum  was then placed in the vagina, and the posterior lip of the cervix was grasped  with tenaculum. The posterior vaginal mucosa was incised and entry was gained posteriorly. Long bill weighted speculum was placed. Attention was then turned to the anterior lip of cervix which was grasped with tenaculum. The cervix was sharply circumcised.  The vaginal mucosa and peritoneum  Were sharply dissected and entry was gained anteriorly.  Zeplin clamps were  then used to clamp the uterosacral ligaments on either side.  They were then cut and sutured ligated with 0 Vicryl . Of note, all sutures used in this case were 0 Vicryl unless otherwise noted.   The cardinal ligaments were then clamped, cut and ligated.  The uterine vessels and broad ligaments were then serially clamped with the Zeplin clamps, cut, and suture ligated on both sides.  Excellent hemostasis was noted at this point.  Due to the size of the uterus, it was morcellated using a bivalved technique.  The uterus was then delivered via the posterior cul-de-sac, and the cornua were clamped with the Zeplin clamps, transected, and the uterus was delivered and sent to pathology. These pedicles were then suture ligated to ensure hemostasis.  After completion of the hysterectomy, The fallopian tube on the right side was grasped with a Kelly clamp, transected and suture ligated. I was unable to see the left fallopian and thus was not removed.  All pedicles from the uterosacral ligament to the cornua were examined, additional sutures were placed and hemostasis was confirmed. The pertineum was closed in a purse string fashion with 2/0 vicryl. The vaginal cuff was closed  using figure of eight sutures.   All instruments were then removed from the pelvis and a vaginal packing saturated with estrogen cream was placed.  The patient tolerated the procedure  well.  All instruments, needles, and sponge counts were correct x 2. The patient was taken to the recovery room in stable  condition.   An experienced assistant was required given the standard of surgical care given the complexity of the case.  This assistant was needed for exposure, dissection, suctioning, retraction, and for overall help during the procedure.

## 2018-07-29 NOTE — Progress Notes (Signed)
Call from nurse about covid testing. Appears decision made not to test for covid as part of pre op screen. Pt hx of covid diagnosed over a month ago. Apparently asymptomatic. Nursing says was advised needs test. Spoke w/ cara cannon with infection prevention, assuming low suspicion for covid, advised send out test. Pt told nurse mild cough last few days. Will check rapid test, though interpretation will depend on clinical status.

## 2018-07-30 ENCOUNTER — Encounter (HOSPITAL_COMMUNITY): Payer: Self-pay | Admitting: Obstetrics and Gynecology

## 2018-07-30 DIAGNOSIS — D251 Intramural leiomyoma of uterus: Secondary | ICD-10-CM | POA: Diagnosis not present

## 2018-07-30 LAB — CBC
HCT: 26.5 % — ABNORMAL LOW (ref 36.0–46.0)
Hemoglobin: 7.4 g/dL — ABNORMAL LOW (ref 12.0–15.0)
MCH: 18.2 pg — ABNORMAL LOW (ref 26.0–34.0)
MCHC: 27.9 g/dL — ABNORMAL LOW (ref 30.0–36.0)
MCV: 65.1 fL — ABNORMAL LOW (ref 80.0–100.0)
Platelets: 196 10*3/uL (ref 150–400)
RBC: 4.07 MIL/uL (ref 3.87–5.11)
RDW: 21.9 % — ABNORMAL HIGH (ref 11.5–15.5)
WBC: 15.1 10*3/uL — ABNORMAL HIGH (ref 4.0–10.5)
nRBC: 0 % (ref 0.0–0.2)

## 2018-07-30 LAB — BPAM RBC
Blood Product Expiration Date: 202008172359
ISSUE DATE / TIME: 202007220855
Unit Type and Rh: 7300

## 2018-07-30 LAB — BASIC METABOLIC PANEL
Anion gap: 8 (ref 5–15)
BUN: 11 mg/dL (ref 6–20)
CO2: 26 mmol/L (ref 22–32)
Calcium: 9.2 mg/dL (ref 8.9–10.3)
Chloride: 99 mmol/L (ref 98–111)
Creatinine, Ser: 0.86 mg/dL (ref 0.44–1.00)
GFR calc Af Amer: >60 mL/min (ref >60)
GFR calc non Af Amer: >60 mL/min (ref >60)
Glucose, Bld: 104 mg/dL — ABNORMAL HIGH (ref 70–99)
Potassium: 4.2 mmol/L (ref 3.5–5.1)
Sodium: 133 mmol/L — ABNORMAL LOW (ref 135–145)

## 2018-07-30 LAB — TYPE AND SCREEN
ABO/RH(D): B POS
Antibody Screen: NEGATIVE
Unit division: 0

## 2018-07-30 MED ORDER — IBUPROFEN 800 MG PO TABS: 800.0000 mg | ORAL_TABLET | Freq: Three times a day (TID) | ORAL | 0 refills | Status: DC | PRN

## 2018-07-30 MED ORDER — OXYCODONE-ACETAMINOPHEN 5-325 MG PO TABS: 1.0000 | ORAL_TABLET | Freq: Four times a day (QID) | ORAL | 0 refills | Status: DC | PRN

## 2018-07-30 NOTE — Discharge Summary (Signed)
Physician Discharge Summary  Patient ID: Angela Brooks MRN: [id number] DOB/AGE: September 11, 1970 48 y.o.  Admit date: 07/29/2018 Discharge date: 07/30/2018  Admission Diagnoses: Menorrhagia, uterine fibroids and chronic anemia   Discharge Diagnoses:  Active Problems:   Post-operative state   Discharged Condition: good  Hospital Course: Pt was admitted with above Dx. She underwent TVH with right salpingectomy without problems. See OP note for additional information. Post OP course was unremarkable. She progressed to ambulating, voiding, + flatus, tolerating diet and good oral pain control. Felt admendable for discharge home on POD # 1.  Discharge instructions, medications and follow up reviewed with pt. Pt verbalized understanding.  Of note pt had COVID 19 a month ago. She was retested during this hospitalization and rapid test returned as positive. She reported no Sx to me and her exam was unremarkable from a cardiopulmonary stand point. ID ( Dr Linus Salmons) was consulted and no additional treatment was necessary during this hospitalization.  Pt was however monitored on 2W as a precautionary measure.   Consults: ID  Significant Diagnostic Studies: labs  Treatments: surgery: TVH and right salpingectomy, Blood transfusion x 1 unit  Discharge Exam: Blood pressure (!) 145/65, pulse 78, temperature 98.2 F (36.8 C), temperature source Oral, resp. rate 20, height 5\' 5"  (1.651 m), weight 88.5 kg, last menstrual period 07/02/2018, SpO2 100 %. Lungs clear Heart RRR Abd soft + BS GU scant spotting noted Ext non tender  Disposition: Discharge disposition: 01-Home or Self Care       Discharge Instructions    Call MD for:  difficulty breathing, headache or visual disturbances   Complete by: As directed    Call MD for:  extreme fatigue   Complete by: As directed    Call MD for:  hives   Complete by: As directed    Call MD for:  persistant dizziness or light-headedness   Complete by: As  directed    Call MD for:  persistant nausea and vomiting   Complete by: As directed    Call MD for:  redness, tenderness, or signs of infection (pain, swelling, redness, odor or green/yellow discharge around incision site)   Complete by: As directed    Call MD for:  severe uncontrolled pain   Complete by: As directed    Call MD for:  temperature >100.4   Complete by: As directed    Diet - low sodium heart healthy   Complete by: As directed    Driving Restrictions   Complete by: As directed    No driving for 10 days   Increase activity slowly   Complete by: As directed    Sexual Activity Restrictions   Complete by: As directed    Pelvic rest x 6 weeks     Allergies as of 07/30/2018   No Known Allergies     Medication List    TAKE these medications   acetaminophen 500 MG tablet Commonly known as: TYLENOL Take 1,000 mg by mouth every 6 (six) hours as needed.   ALPRAZolam 0.5 MG tablet Commonly known as: XANAX Take 1 tablet (0.5 mg total) by mouth 3 (three) times daily as needed for anxiety.   ibuprofen 800 MG tablet Commonly known as: ADVIL Take 1 tablet (800 mg total) by mouth every 8 (eight) hours as needed.   Immune Enhance Tabs Take by mouth.   multivitamin with minerals Tabs tablet Take 2 tablets by mouth daily.   oxyCODONE-acetaminophen 5-325 MG tablet Commonly known as: PERCOCET/ROXICET Take 1 tablet by  mouth every 6 (six) hours as needed for moderate pain.   vitamin C 1000 MG tablet Take 1,000 mg by mouth daily.      Follow-up Information    [facility]. Schedule an appointment as soon as possible for a visit in 4 week(s).   Why: Post op appt with Dr. Gardiner Fanti, (face to face) Contact information: [address] [address] [phone]          Signed: Chancy Milroy 07/30/2018, 7:35 AM

## 2018-07-30 NOTE — Progress Notes (Signed)
Pt resting comfortably in bed this am, reporting 3/10 pain.  Scant old dried blood on peripad on assessment.  Foley discontinued and waiting for pt to void.

## 2018-07-30 NOTE — Discharge Instructions (Signed)
Vaginal Hysterectomy, Care After Refer to this sheet in the next few weeks. These instructions provide you with information about caring for yourself after your procedure. Your health care provider may also give you more specific instructions. Your treatment has been planned according to current medical practices, but problems sometimes occur. Call your health care provider if you have any problems or questions after your procedure. What can I expect after the procedure? After the procedure, it is common to have:  Pain.  Soreness and numbness in your incision areas.  Vaginal bleeding and discharge.  Constipation.  Temporary problems emptying the bladder.  Feelings of sadness or other emotions. Follow these instructions at home: Medicines  Take over-the-counter and prescription medicines only as told by your health care provider.  If you were prescribed an antibiotic medicine, take it as told by your health care provider. Do not stop taking the antibiotic even if you start to feel better.  Do not drive or operate heavy machinery while taking prescription pain medicine. Activity  Return to your normal activities as told by your health care provider. Ask your health care provider what activities are safe for you.  Get regular exercise as told by your health care provider. You may be told to take short walks every day and go farther each time.  Do not lift anything that is heavier than 10 lb (4.5 kg). General instructions   Do not put anything in your vagina for 6 weeks after your surgery or as told by your health care provider. This includes tampons and douches.  Do not have sex until your health care provider says you can.  Do not take baths, swim, or use a hot tub until your health care provider approves.  Drink enough fluid to keep your urine clear or pale yellow.  Do not drive for 24 hours if you were given a sedative.  Keep all follow-up visits as told by your health  care provider. This is important. Contact a health care provider if:  Your pain medicine is not helping.  You have a fever.  You have redness, swelling, or pain at your incision site.  You have blood, pus, or a bad-smelling discharge from your vagina.  You continue to have difficulty urinating. Get help right away if:  You have severe abdominal or back pain.  You have heavy bleeding from your vagina.  You have chest pain or shortness of breath. This information is not intended to replace advice given to you by your health care provider. Make sure you discuss any questions you have with your health care provider. Document Released: 04/17/2015 Document Revised: 08/17/2015 Document Reviewed: 01/08/2015 Elsevier Patient Education  2020 [employer].

## 2018-08-04 NOTE — Progress Notes (Signed)
Note provided for pt records  Discharge summary confirms pt will need Pelvic Rest x 6 wks.

## 2018-08-19 ENCOUNTER — Other Ambulatory Visit: Payer: Self-pay

## 2018-08-19 ENCOUNTER — Encounter: Payer: Self-pay | Admitting: Obstetrics and Gynecology

## 2018-08-19 ENCOUNTER — Ambulatory Visit (INDEPENDENT_AMBULATORY_CARE_PROVIDER_SITE_OTHER): Payer: BC Managed Care – PPO | Admitting: Obstetrics and Gynecology

## 2018-08-19 VITALS — BP 151/92 | HR 83 | Wt 199.6 lb

## 2018-08-19 DIAGNOSIS — Z9889 Other specified postprocedural states: Secondary | ICD-10-CM

## 2018-08-19 MED ORDER — METRONIDAZOLE 0.75 % VA GEL: 1.0000 | Freq: Every day | VAGINAL | 1 refills | Status: DC

## 2018-08-19 NOTE — Progress Notes (Signed)
Ms Tiegs presents for post op visit TVH/RSO 3 weeks ago. Pathology reviewed with pt Doing well overall. Some vaginal discharge and constipation. No bladder dysfunction Tolerating diet.   PE AF VSS Lungs clear  Heart RRR Abd soft + BS GU NL EGBUS, white vaginal discharge cuff well healed, no tenderness  A/P Post OP        Vgainal discharge        Constipation Pt instructed to d/c narcotic pain medication. Pt taking 1/2 tablet a day. Use Motrin as need. Miralax as needed. Metrogel ordered. Return to nl ADL's as tolerates. Return to work note provided. F/U in 1 yr for yearly exam or PRN

## 2018-08-19 NOTE — Progress Notes (Signed)
Pt is post op 3 weeks- vaginal hyst with R salpingectomy. Pt reports she is having symptoms of constipation, stool softeners are not giving her relief. Pt also reports a small amount of vaginal discharge with itching and irritation since 3 days after surgery.

## 2018-08-19 NOTE — Patient Instructions (Signed)
Health Maintenance, Female Adopting a healthy lifestyle and getting preventive care are important in promoting health and wellness. Ask your health care provider about:  The right schedule for you to have regular tests and exams.  Things you can do on your own to prevent diseases and keep yourself healthy. What should I know about diet, weight, and exercise? Eat a healthy diet   Eat a diet that includes plenty of vegetables, fruits, low-fat dairy products, and lean protein.  Do not eat a lot of foods that are high in solid fats, added sugars, or sodium. Maintain a healthy weight Body mass index (BMI) is used to identify weight problems. It estimates body fat based on height and weight. Your health care provider can help determine your BMI and help you achieve or maintain a healthy weight. Get regular exercise Get regular exercise. This is one of the most important things you can do for your health. Most adults should:  Exercise for at least 150 minutes each week. The exercise should increase your heart rate and make you sweat (moderate-intensity exercise).  Do strengthening exercises at least twice a week. This is in addition to the moderate-intensity exercise.  Spend less time sitting. Even light physical activity can be beneficial. Watch cholesterol and blood lipids Have your blood tested for lipids and cholesterol at 49 years of age, then have this test every 5 years. Have your cholesterol levels checked more often if:  Your lipid or cholesterol levels are high.  You are older than 49 years of age.  You are at high risk for heart disease. What should I know about cancer screening? Depending on your health history and family history, you may need to have cancer screening at various ages. This may include screening for:  Breast cancer.  Cervical cancer.  Colorectal cancer.  Skin cancer.  Lung cancer. What should I know about heart disease, diabetes, and high blood  pressure? Blood pressure and heart disease  High blood pressure causes heart disease and increases the risk of stroke. This is more likely to develop in people who have high blood pressure readings, are of African descent, or are overweight.  Have your blood pressure checked: ? Every 3-5 years if you are 18-39 years of age. ? Every year if you are 40 years old or older. Diabetes Have regular diabetes screenings. This checks your fasting blood sugar level. Have the screening done:  Once every three years after age 40 if you are at a normal weight and have a low risk for diabetes.  More often and at a younger age if you are overweight or have a high risk for diabetes. What should I know about preventing infection? Hepatitis B If you have a higher risk for hepatitis B, you should be screened for this virus. Talk with your health care provider to find out if you are at risk for hepatitis B infection. Hepatitis C Testing is recommended for:  Everyone born from 1945 through 1965.  Anyone with known risk factors for hepatitis C. Sexually transmitted infections (STIs)  Get screened for STIs, including gonorrhea and chlamydia, if: ? You are sexually active and are younger than 49 years of age. ? You are older than 49 years of age and your health care provider tells you that you are at risk for this type of infection. ? Your sexual activity has changed since you were last screened, and you are at increased risk for chlamydia or gonorrhea. Ask your health care provider if   you are at risk.  Ask your health care provider about whether you are at high risk for HIV. Your health care provider may recommend a prescription medicine to help prevent HIV infection. If you choose to take medicine to prevent HIV, you should first get tested for HIV. You should then be tested every 3 months for as long as you are taking the medicine. Pregnancy  If you are about to stop having your period (premenopausal) and  you may become pregnant, seek counseling before you get pregnant.  Take 400 to 800 micrograms (mcg) of folic acid every day if you become pregnant.  Ask for birth control (contraception) if you want to prevent pregnancy. Osteoporosis and menopause Osteoporosis is a disease in which the bones lose minerals and strength with aging. This can result in bone fractures. If you are 65 years old or older, or if you are at risk for osteoporosis and fractures, ask your health care provider if you should:  Be screened for bone loss.  Take a calcium or vitamin D supplement to lower your risk of fractures.  Be given hormone replacement therapy (HRT) to treat symptoms of menopause. Follow these instructions at home: Lifestyle  Do not use any products that contain nicotine or tobacco, such as cigarettes, e-cigarettes, and chewing tobacco. If you need help quitting, ask your health care provider.  Do not use street drugs.  Do not share needles.  Ask your health care provider for help if you need support or information about quitting drugs. Alcohol use  Do not drink alcohol if: ? Your health care provider tells you not to drink. ? You are pregnant, may be pregnant, or are planning to become pregnant.  If you drink alcohol: ? Limit how much you use to 0-1 drink a day. ? Limit intake if you are breastfeeding.  Be aware of how much alcohol is in your drink. In the [place] one drink equals one 12 oz bottle of beer (355 mL), one 5 oz glass of wine (148 mL), or one 1 oz glass of hard liquor (44 mL). General instructions  Schedule regular health, dental, and eye exams.  Stay current with your vaccines.  Tell your health care provider if: ? You often feel depressed. ? You have ever been abused or do not feel safe at home. Summary  Adopting a healthy lifestyle and getting preventive care are important in promoting health and wellness.  Follow your health care provider's instructions about healthy  diet, exercising, and getting tested or screened for diseases.  Follow your health care provider's instructions on monitoring your cholesterol and blood pressure. This information is not intended to replace advice given to you by your health care provider. Make sure you discuss any questions you have with your health care provider. Document Released: 07/09/2010 Document Revised: 12/17/2017 Document Reviewed: 12/17/2017 Elsevier Patient Education  2020 [employer]  

## 2018-08-20 ENCOUNTER — Encounter: Payer: Self-pay | Admitting: Family Medicine

## 2018-09-01 ENCOUNTER — Ambulatory Visit (INDEPENDENT_AMBULATORY_CARE_PROVIDER_SITE_OTHER): Payer: BC Managed Care – PPO | Admitting: Registered Nurse

## 2018-09-01 ENCOUNTER — Encounter: Payer: Self-pay | Admitting: Registered Nurse

## 2018-09-01 ENCOUNTER — Other Ambulatory Visit: Payer: Self-pay

## 2018-09-01 VITALS — BP 155/97 | HR 94 | Temp 99.0°F | Resp 16 | Ht 66.73 in | Wt 204.0 lb

## 2018-09-01 DIAGNOSIS — I10 Essential (primary) hypertension: Secondary | ICD-10-CM

## 2018-09-01 MED ORDER — HYDROCHLOROTHIAZIDE 25 MG PO TABS: 25.0000 mg | ORAL_TABLET | Freq: Every day | ORAL | 1 refills | Status: DC

## 2018-09-01 NOTE — Progress Notes (Signed)
Established Patient Office Visit  Subjective:  Patient ID: Angela Brooks, female    DOB: 02/07/1970  Age: 48 y.o. MRN: [id number]  CC:  Chief Complaint  Patient presents with  . Establish Care    need new pcp to manage medications   . Hypertension    need to restart B/P medication     HPI Angela Brooks presents for visit to establish care. Formerly pt of Dr. Brigitte Pulse. Has unfortunately been lost to follow up for treatment of her HTN, has been off of her HCTZ for 1 year. In that time, she has experienced the loss of her father and had a total hysterectomy. Denies symptoms of HTN including headaches, visual changes, shob, chest pain, leg swelling, palpitations, and others. Overall feels well. Recently returned to work after hysterectomy - some concern that she is returning too soon. Suggested following up with surgeon for best advice, but in general, eat well, stay hydrated, and limit physical activity for at least 2 mos following surgery.   Past Medical History:  Diagnosis Date  . Allergy   . Anemia   . Anxiety   . Depression   . GERD (gastroesophageal reflux disease)   . Hypertension    Stopped taking meds x 1 year ago    Past Surgical History:  Procedure Laterality Date  . ABDOMINAL HYSTERECTOMY  07/29/2018  . CHOLECYSTECTOMY    . VAGINAL HYSTERECTOMY Right 07/29/2018   Procedure: HYSTERECTOMY VAGINAL Right  SALPINGECTOMY;  Surgeon: Chancy Milroy, MD;  Location: [facility];  Service: Gynecology;  Laterality: Right;  . WISDOM TOOTH EXTRACTION      Family History  Problem Relation Age of Onset  . Hypertension Mother   . Diabetes Maternal Grandmother     Social History   Socioeconomic History  . Marital status: Single    Spouse name: Not on file  . Number of children: Not on file  . Years of education: Not on file  . Highest education level: Not on file  Occupational History  . Not on file  Social Needs  . Financial resource strain: Not on file  . Food insecurity   Worry: Not on file    Inability: Not on file  . Transportation needs    Medical: Not on file    Non-medical: Not on file  Tobacco Use  . Smoking status: Never Smoker  . Smokeless tobacco: Never Used  Substance and Sexual Activity  . Alcohol use: No    Alcohol/week: 0.0 standard drinks  . Drug use: No  . Sexual activity: Yes    Birth control/protection: None  Lifestyle  . Physical activity    Days per week: Not on file    Minutes per session: Not on file  . Stress: Not on file  Relationships  . Social Herbalist on phone: Not on file    Gets together: Not on file    Attends religious service: Not on file    Active member of club or organization: Not on file    Attends meetings of clubs or organizations: Not on file    Relationship status: Not on file  . Intimate partner violence    Fear of current or ex partner: Not on file    Emotionally abused: Not on file    Physically abused: Not on file    Forced sexual activity: Not on file  Other Topics Concern  . Not on file  Social History Narrative  . Not on file  Outpatient Medications Prior to Visit  Medication Sig Dispense Refill  . acetaminophen (TYLENOL) 500 MG tablet Take 1,000 mg by mouth every 6 (six) hours as needed.    . ALPRAZolam (XANAX) 0.5 MG tablet Take 1 tablet (0.5 mg total) by mouth 3 (three) times daily as needed for anxiety. 90 tablet 1  . Ascorbic Acid (VITAMIN C) 1000 MG tablet Take 1,000 mg by mouth daily.    Marland Kitchen ibuprofen (ADVIL) 800 MG tablet Take 1 tablet (800 mg total) by mouth every 8 (eight) hours as needed. 30 tablet 0  . Multiple Vitamin (MULTIVITAMIN WITH MINERALS) TABS tablet Take 2 tablets by mouth daily.    . Nutritional Supplements (IMMUNE ENHANCE) TABS Take by mouth.    . metroNIDAZOLE (METROGEL) 0.75 % vaginal gel Place 1 Applicatorful vaginally at bedtime. Apply one applicatorful to vagina at bedtime for 7 days 70 g 1  . oxyCODONE-acetaminophen (PERCOCET/ROXICET) 5-325 MG tablet  Take 1 tablet by mouth every 6 (six) hours as needed for moderate pain. 24 tablet 0   No facility-administered medications prior to visit.     No Known Allergies  ROS Review of Systems  Constitutional: Negative.   HENT: Negative.   Eyes: Negative.   Respiratory: Negative.   Cardiovascular: Negative.   Gastrointestinal: Negative.   Endocrine: Negative.   Genitourinary: Negative.   Musculoskeletal: Negative.   Allergic/Immunologic: Negative.   Neurological: Negative.   Hematological: Negative.   Psychiatric/Behavioral: Negative.   All other systems reviewed and are negative.     Objective:    Physical Exam  Constitutional: She is oriented to person, place, and time. She appears well-developed and well-nourished. No distress.  Cardiovascular: Normal rate, regular rhythm and normal heart sounds.  Pulmonary/Chest: Effort normal. No respiratory distress.  Neurological: She is alert and oriented to person, place, and time.  Skin: Skin is warm and dry. No rash noted. She is not diaphoretic. No erythema. No pallor.  Psychiatric: She has a normal mood and affect. Her behavior is normal. Thought content normal.  Nursing note and vitals reviewed.   BP (!) 155/97   Pulse 94   Temp 99 F (37.2 C) (Oral)   Resp 16   Ht 5' 6.73" (1.695 m)   Wt 204 lb (92.5 kg)   LMP 07/02/2018 (Exact Date)   SpO2 99%   BMI 32.21 kg/m  Wt Readings from Last 3 Encounters:  09/01/18 204 lb (92.5 kg)  08/19/18 199 lb 9.6 oz (90.5 kg)  07/29/18 195 lb (88.5 kg)     Health Maintenance Due  Topic Date Due  . PAP SMEAR-Modifier  02/06/2017  . INFLUENZA VACCINE  08/08/2018    There are no preventive care reminders to display for this patient.  Lab Results  Component Value Date   TSH 1.520 04/17/2017   Lab Results  Component Value Date   WBC 15.1 (H) 07/30/2018   HGB 7.4 (L) 07/30/2018   HCT 26.5 (L) 07/30/2018   MCV 65.1 (L) 07/30/2018   PLT 196 07/30/2018   Lab Results  Component  Value Date   NA 133 (L) 07/30/2018   K 4.2 07/30/2018   CO2 26 07/30/2018   GLUCOSE 104 (H) 07/30/2018   BUN 11 07/30/2018   CREATININE 0.86 07/30/2018   BILITOT 1.0 12/29/2017   ALKPHOS 53 12/29/2017   AST 19 12/29/2017   ALT 19 12/29/2017   PROT 8.0 12/29/2017   ALBUMIN 4.0 12/29/2017   CALCIUM 9.2 07/30/2018   ANIONGAP 8 07/30/2018  Lab Results  Component Value Date   CHOL 141 02/06/2014   Lab Results  Component Value Date   HDL 48 02/06/2014   Lab Results  Component Value Date   LDLCALC 83 02/06/2014   Lab Results  Component Value Date   TRIG 49 02/06/2014   Lab Results  Component Value Date   CHOLHDL 2.9 02/06/2014   Lab Results  Component Value Date   HGBA1C 5.5 04/17/2017      Assessment & Plan:   Problem List Items Addressed This Visit      Cardiovascular and Mediastinum   Essential hypertension - Primary      No orders of the defined types were placed in this encounter.   Follow-up: No follow-ups on file.   PLAN  Restart HCTZ 25mg  PO qam. She has tolerated this well in the past. Put emphasis on staying hydrated.  Return in 2 weeks for nurse visit BP check  Return in 6 mo for med check, CPE, fasting labs  Patient encouraged to call clinic with any questions, comments, or concerns.   Maximiano Coss, NP

## 2018-09-01 NOTE — Patient Instructions (Addendum)
   If you have lab work done today you will be contacted with your lab results within the next 2 weeks.  If you have not heard from us then please contact us. The fastest way to get your results is to register for My Chart.   IF you received an x-ray today, you will receive an invoice from [facility]. Please contact [facility] at [phone] with questions or concerns regarding your invoice.   IF you received labwork today, you will receive an invoice from [facility]. Please contact [facility] at [phone] with questions or concerns regarding your invoice.   Our billing staff will not be able to assist you with questions regarding bills from these companies.  You will be contacted with the lab results as soon as they are available. The fastest way to get your results is to activate your My Chart account. Instructions are located on the last page of this paperwork. If you have not heard from us regarding the results in 2 weeks, please contact this office.       Hypertension, Adult High blood pressure (hypertension) is when the force of blood pumping through the arteries is too strong. The arteries are the blood vessels that carry blood from the heart throughout the body. Hypertension forces the heart to work harder to pump blood and may cause arteries to become narrow or stiff. Untreated or uncontrolled hypertension can cause a heart attack, heart failure, a stroke, kidney disease, and other problems. A blood pressure reading consists of a higher number over a lower number. Ideally, your blood pressure should be below 120/80. The first ("top") number is called the systolic pressure. It is a measure of the pressure in your arteries as your heart beats. The second ("bottom") number is called the diastolic pressure. It is a measure of the pressure in your arteries as the heart relaxes. What are the causes? The exact cause of this condition is not known. There are some  conditions that result in or are related to high blood pressure. What increases the risk? Some risk factors for high blood pressure are under your control. The following factors may make you more likely to develop this condition:  Smoking.  Having type 2 diabetes mellitus, high cholesterol, or both.  Not getting enough exercise or physical activity.  Being overweight.  Having too much fat, sugar, calories, or salt (sodium) in your diet.  Drinking too much alcohol. Some risk factors for high blood pressure may be difficult or impossible to change. Some of these factors include:  Having chronic kidney disease.  Having a family history of high blood pressure.  Age. Risk increases with age.  Race. You may be at higher risk if you are African American.  Gender. Men are at higher risk than women before age 45. After age 65, women are at higher risk than men.  Having obstructive sleep apnea.  Stress. What are the signs or symptoms? High blood pressure may not cause symptoms. Very high blood pressure (hypertensive crisis) may cause:  Headache.  Anxiety.  Shortness of breath.  Nosebleed.  Nausea and vomiting.  Vision changes.  Severe chest pain.  Seizures. How is this diagnosed? This condition is diagnosed by measuring your blood pressure while you are seated, with your arm resting on a flat surface, your legs uncrossed, and your feet flat on the floor. The cuff of the blood pressure monitor will be placed directly against the skin of your upper arm at the level of   your heart. It should be measured at least twice using the same arm. Certain conditions can cause a difference in blood pressure between your right and left arms. Certain factors can cause blood pressure readings to be lower or higher than normal for a short period of time:  When your blood pressure is higher when you are in a health care provider's office than when you are at home, this is called white coat  hypertension. Most people with this condition do not need medicines.  When your blood pressure is higher at home than when you are in a health care provider's office, this is called masked hypertension. Most people with this condition may need medicines to control blood pressure. If you have a high blood pressure reading during one visit or you have normal blood pressure with other risk factors, you may be asked to:  Return on a different day to have your blood pressure checked again.  Monitor your blood pressure at home for 1 week or longer. If you are diagnosed with hypertension, you may have other blood or imaging tests to help your health care provider understand your overall risk for other conditions. How is this treated? This condition is treated by making healthy lifestyle changes, such as eating healthy foods, exercising more, and reducing your alcohol intake. Your health care provider may prescribe medicine if lifestyle changes are not enough to get your blood pressure under control, and if:  Your systolic blood pressure is above 130.  Your diastolic blood pressure is above 80. Your personal target blood pressure may vary depending on your medical conditions, your age, and other factors. Follow these instructions at home: Eating and drinking   Eat a diet that is high in fiber and potassium, and low in sodium, added sugar, and fat. An example eating plan is called the DASH (Dietary Approaches to Stop Hypertension) diet. To eat this way: ? Eat plenty of fresh fruits and vegetables. Try to fill one half of your plate at each meal with fruits and vegetables. ? Eat whole grains, such as whole-wheat pasta, brown rice, or whole-grain bread. Fill about one fourth of your plate with whole grains. ? Eat or drink low-fat dairy products, such as skim milk or low-fat yogurt. ? Avoid fatty cuts of meat, processed or cured meats, and poultry with skin. Fill about one fourth of your plate with lean  proteins, such as fish, chicken without skin, beans, eggs, or tofu. ? Avoid pre-made and processed foods. These tend to be higher in sodium, added sugar, and fat.  Reduce your daily sodium intake. Most people with hypertension should eat less than 1,500 mg of sodium a day.  Do not drink alcohol if: ? Your health care provider tells you not to drink. ? You are pregnant, may be pregnant, or are planning to become pregnant.  If you drink alcohol: ? Limit how much you use to:  0-1 drink a day for women.  0-2 drinks a day for men. ? Be aware of how much alcohol is in your drink. In the [place] one drink equals one 12 oz bottle of beer (355 mL), one 5 oz glass of wine (148 mL), or one 1 oz glass of hard liquor (44 mL). Lifestyle   Work with your health care provider to maintain a healthy body weight or to lose weight. Ask what an ideal weight is for you.  Get at least 30 minutes of exercise most days of the week. Activities may include walking,   swimming, or biking.  Include exercise to strengthen your muscles (resistance exercise), such as Pilates or lifting weights, as part of your weekly exercise routine. Try to do these types of exercises for 30 minutes at least 3 days a week.  Do not use any products that contain nicotine or tobacco, such as cigarettes, e-cigarettes, and chewing tobacco. If you need help quitting, ask your health care provider.  Monitor your blood pressure at home as told by your health care provider.  Keep all follow-up visits as told by your health care provider. This is important. Medicines  Take over-the-counter and prescription medicines only as told by your health care provider. Follow directions carefully. Blood pressure medicines must be taken as prescribed.  Do not skip doses of blood pressure medicine. Doing this puts you at risk for problems and can make the medicine less effective.  Ask your health care provider about side effects or reactions to  medicines that you should watch for. Contact a health care provider if you:  Think you are having a reaction to a medicine you are taking.  Have headaches that keep coming back (recurring).  Feel dizzy.  Have swelling in your ankles.  Have trouble with your vision. Get help right away if you:  Develop a severe headache or confusion.  Have unusual weakness or numbness.  Feel faint.  Have severe pain in your chest or abdomen.  Vomit repeatedly.  Have trouble breathing. Summary  Hypertension is when the force of blood pumping through your arteries is too strong. If this condition is not controlled, it may put you at risk for serious complications.  Your personal target blood pressure may vary depending on your medical conditions, your age, and other factors. For most people, a normal blood pressure is less than 120/80.  Hypertension is treated with lifestyle changes, medicines, or a combination of both. Lifestyle changes include losing weight, eating a healthy, low-sodium diet, exercising more, and limiting alcohol. This information is not intended to replace advice given to you by your health care provider. Make sure you discuss any questions you have with your health care provider. Document Released: 12/24/2004 Document Revised: 09/03/2017 Document Reviewed: 09/03/2017 Elsevier Patient Education  2020 [employer] Inc.   Managing Your Hypertension Hypertension is commonly called high blood pressure. This is when the force of your blood pressing against the walls of your arteries is too strong. Arteries are blood vessels that carry blood from your heart throughout your body. Hypertension forces the heart to work harder to pump blood, and may cause the arteries to become narrow or stiff. Having untreated or uncontrolled hypertension can cause heart attack, stroke, kidney disease, and other problems. What are blood pressure readings? A blood pressure reading consists of a higher  number over a lower number. Ideally, your blood pressure should be below 120/80. The first ("top") number is called the systolic pressure. It is a measure of the pressure in your arteries as your heart beats. The second ("bottom") number is called the diastolic pressure. It is a measure of the pressure in your arteries as the heart relaxes. What does my blood pressure reading mean? Blood pressure is classified into four stages. Based on your blood pressure reading, your health care provider may use the following stages to determine what type of treatment you need, if any. Systolic pressure and diastolic pressure are measured in a unit called mm Hg. Normal  Systolic pressure: below 120.  Diastolic pressure: below 80. Elevated  Systolic pressure: 120-129.    Diastolic pressure: below 80. Hypertension stage 1  Systolic pressure: 130-139.  Diastolic pressure: 80-89. Hypertension stage 2  Systolic pressure: 140 or above.  Diastolic pressure: 90 or above. What health risks are associated with hypertension? Managing your hypertension is an important responsibility. Uncontrolled hypertension can lead to:  A heart attack.  A stroke.  A weakened blood vessel (aneurysm).  Heart failure.  Kidney damage.  Eye damage.  Metabolic syndrome.  Memory and concentration problems. What changes can I make to manage my hypertension? Hypertension can be managed by making lifestyle changes and possibly by taking medicines. Your health care provider will help you make a plan to bring your blood pressure within a normal range. Eating and drinking   Eat a diet that is high in fiber and potassium, and low in salt (sodium), added sugar, and fat. An example eating plan is called the DASH (Dietary Approaches to Stop Hypertension) diet. To eat this way: ? Eat plenty of fresh fruits and vegetables. Try to fill half of your plate at each meal with fruits and vegetables. ? Eat whole grains, such as whole  wheat pasta, brown rice, or whole grain bread. Fill about one quarter of your plate with whole grains. ? Eat low-fat diary products. ? Avoid fatty cuts of meat, processed or cured meats, and poultry with skin. Fill about one quarter of your plate with lean proteins such as fish, chicken without skin, beans, eggs, and tofu. ? Avoid premade and processed foods. These tend to be higher in sodium, added sugar, and fat.  Reduce your daily sodium intake. Most people with hypertension should eat less than 1,500 mg of sodium a day.  Limit alcohol intake to no more than 1 drink a day for nonpregnant women and 2 drinks a day for men. One drink equals 12 oz of beer, 5 oz of wine, or 1 oz of hard liquor. Lifestyle  Work with your health care provider to maintain a healthy body weight, or to lose weight. Ask what an ideal weight is for you.  Get at least 30 minutes of exercise that causes your heart to beat faster (aerobic exercise) most days of the week. Activities may include walking, swimming, or biking.  Include exercise to strengthen your muscles (resistance exercise), such as weight lifting, as part of your weekly exercise routine. Try to do these types of exercises for 30 minutes at least 3 days a week.  Do not use any products that contain nicotine or tobacco, such as cigarettes and e-cigarettes. If you need help quitting, ask your health care provider.  Control any long-term (chronic) conditions you have, such as high cholesterol or diabetes. Monitoring  Monitor your blood pressure at home as told by your health care provider. Your personal target blood pressure may vary depending on your medical conditions, your age, and other factors.  Have your blood pressure checked regularly, as often as told by your health care provider. Working with your health care provider  Review all the medicines you take with your health care provider because there may be side effects or interactions.  Talk with  your health care provider about your diet, exercise habits, and other lifestyle factors that may be contributing to hypertension.  Visit your health care provider regularly. Your health care provider can help you create and adjust your plan for managing hypertension. Will I need medicine to control my blood pressure? Your health care provider may prescribe medicine if lifestyle changes are not enough to get your   blood pressure under control, and if:  Your systolic blood pressure is 130 or higher.  Your diastolic blood pressure is 80 or higher. Take medicines only as told by your health care provider. Follow the directions carefully. Blood pressure medicines must be taken as prescribed. The medicine does not work as well when you skip doses. Skipping doses also puts you at risk for problems. Contact a health care provider if:  You think you are having a reaction to medicines you have taken.  You have repeated (recurrent) headaches.  You feel dizzy.  You have swelling in your ankles.  You have trouble with your vision. Get help right away if:  You develop a severe headache or confusion.  You have unusual weakness or numbness, or you feel faint.  You have severe pain in your chest or abdomen.  You vomit repeatedly.  You have trouble breathing. Summary  Hypertension is when the force of blood pumping through your arteries is too strong. If this condition is not controlled, it may put you at risk for serious complications.  Your personal target blood pressure may vary depending on your medical conditions, your age, and other factors. For most people, a normal blood pressure is less than 120/80.  Hypertension is managed by lifestyle changes, medicines, or both. Lifestyle changes include weight loss, eating a healthy, low-sodium diet, exercising more, and limiting alcohol. This information is not intended to replace advice given to you by your health care provider. Make sure you  discuss any questions you have with your health care provider. Document Released: 09/18/2011 Document Revised: 04/17/2018 Document Reviewed: 11/22/2015 Elsevier Patient Education  2020 [employer] Inc.   DASH Eating Plan DASH stands for "Dietary Approaches to Stop Hypertension." The DASH eating plan is a healthy eating plan that has been shown to reduce high blood pressure (hypertension). It may also reduce your risk for type 2 diabetes, heart disease, and stroke. The DASH eating plan may also help with weight loss. What are tips for following this plan?  General guidelines  Avoid eating more than 2,300 mg (milligrams) of salt (sodium) a day. If you have hypertension, you may need to reduce your sodium intake to 1,500 mg a day.  Limit alcohol intake to no more than 1 drink a day for nonpregnant women and 2 drinks a day for men. One drink equals 12 oz of beer, 5 oz of wine, or 1 oz of hard liquor.  Work with your health care provider to maintain a healthy body weight or to lose weight. Ask what an ideal weight is for you.  Get at least 30 minutes of exercise that causes your heart to beat faster (aerobic exercise) most days of the week. Activities may include walking, swimming, or biking.  Work with your health care provider or diet and nutrition specialist (dietitian) to adjust your eating plan to your individual calorie needs. Reading food labels   Check food labels for the amount of sodium per serving. Choose foods with less than 5 percent of the Daily Value of sodium. Generally, foods with less than 300 mg of sodium per serving fit into this eating plan.  To find whole grains, look for the word "whole" as the first word in the ingredient list. Shopping  Buy products labeled as "low-sodium" or "no salt added."  Buy fresh foods. Avoid canned foods and premade or frozen meals. Cooking  Avoid adding salt when cooking. Use salt-free seasonings or herbs instead of table salt or sea salt.    Check with your health care provider or pharmacist before using salt substitutes.  Do not fry foods. Cook foods using healthy methods such as baking, boiling, grilling, and broiling instead.  Cook with heart-healthy oils, such as olive, canola, soybean, or sunflower oil. Meal planning  Eat a balanced diet that includes: ? 5 or more servings of fruits and vegetables each day. At each meal, try to fill half of your plate with fruits and vegetables. ? Up to 6-8 servings of whole grains each day. ? Less than 6 oz of lean meat, poultry, or fish each day. A 3-oz serving of meat is about the same size as a deck of cards. One egg equals 1 oz. ? 2 servings of low-fat dairy each day. ? A serving of nuts, seeds, or beans 5 times each week. ? Heart-healthy fats. Healthy fats called Omega-3 fatty acids are found in foods such as flaxseeds and coldwater fish, like sardines, salmon, and mackerel.  Limit how much you eat of the following: ? Canned or prepackaged foods. ? Food that is high in trans fat, such as fried foods. ? Food that is high in saturated fat, such as fatty meat. ? Sweets, desserts, sugary drinks, and other foods with added sugar. ? Full-fat dairy products.  Do not salt foods before eating.  Try to eat at least 2 vegetarian meals each week.  Eat more home-cooked food and less restaurant, buffet, and fast food.  When eating at a restaurant, ask that your food be prepared with less salt or no salt, if possible. What foods are recommended? The items listed may not be a complete list. Talk with your dietitian about what dietary choices are best for you. Grains Whole-grain or whole-wheat bread. Whole-grain or whole-wheat pasta. Brown rice. Oatmeal. Quinoa. Bulgur. Whole-grain and low-sodium cereals. Pita bread. Low-fat, low-sodium crackers. Whole-wheat flour tortillas. Vegetables Fresh or frozen vegetables (raw, steamed, roasted, or grilled). Low-sodium or reduced-sodium tomato and  vegetable juice. Low-sodium or reduced-sodium tomato sauce and tomato paste. Low-sodium or reduced-sodium canned vegetables. Fruits All fresh, dried, or frozen fruit. Canned fruit in natural juice (without added sugar). Meat and other protein foods Skinless chicken or turkey. Ground chicken or turkey. Pork with fat trimmed off. Fish and seafood. Egg whites. Dried beans, peas, or lentils. Unsalted nuts, nut butters, and seeds. Unsalted canned beans. Lean cuts of beef with fat trimmed off. Low-sodium, lean deli meat. Dairy Low-fat (1%) or fat-free (skim) milk. Fat-free, low-fat, or reduced-fat cheeses. Nonfat, low-sodium ricotta or cottage cheese. Low-fat or nonfat yogurt. Low-fat, low-sodium cheese. Fats and oils Soft margarine without trans fats. Vegetable oil. Low-fat, reduced-fat, or light mayonnaise and salad dressings (reduced-sodium). Canola, safflower, olive, soybean, and sunflower oils. Avocado. Seasoning and other foods Herbs. Spices. Seasoning mixes without salt. Unsalted popcorn and pretzels. Fat-free sweets. What foods are not recommended? The items listed may not be a complete list. Talk with your dietitian about what dietary choices are best for you. Grains Baked goods made with fat, such as croissants, muffins, or some breads. Dry pasta or rice meal packs. Vegetables Creamed or fried vegetables. Vegetables in a cheese sauce. Regular canned vegetables (not low-sodium or reduced-sodium). Regular canned tomato sauce and paste (not low-sodium or reduced-sodium). Regular tomato and vegetable juice (not low-sodium or reduced-sodium). Pickles. Olives. Fruits Canned fruit in a light or heavy syrup. Fried fruit. Fruit in cream or butter sauce. Meat and other protein foods Fatty cuts of meat. Ribs. Fried meat. Bacon. Sausage. Bologna and other processed lunch   meats. Salami. Fatback. Hotdogs. Bratwurst. Salted nuts and seeds. Canned beans with added salt. Canned or smoked fish. Whole eggs or  egg yolks. Chicken or turkey with skin. Dairy Whole or 2% milk, cream, and half-and-half. Whole or full-fat cream cheese. Whole-fat or sweetened yogurt. Full-fat cheese. Nondairy creamers. Whipped toppings. Processed cheese and cheese spreads. Fats and oils Butter. Stick margarine. Lard. Shortening. Ghee. Bacon fat. Tropical oils, such as coconut, palm kernel, or palm oil. Seasoning and other foods Salted popcorn and pretzels. Onion salt, garlic salt, seasoned salt, table salt, and sea salt. Worcestershire sauce. Tartar sauce. Barbecue sauce. Teriyaki sauce. Soy sauce, including reduced-sodium. Steak sauce. Canned and packaged gravies. Fish sauce. Oyster sauce. Cocktail sauce. Horseradish that you find on the shelf. Ketchup. Mustard. Meat flavorings and tenderizers. Bouillon cubes. Hot sauce and Tabasco sauce. Premade or packaged marinades. Premade or packaged taco seasonings. Relishes. Regular salad dressings. Where to find more information:  National Heart, Lung, and Blood Institute: www.nhlbi.nih.gov  American Heart Association: www.heart.org Summary  The DASH eating plan is a healthy eating plan that has been shown to reduce high blood pressure (hypertension). It may also reduce your risk for type 2 diabetes, heart disease, and stroke.  With the DASH eating plan, you should limit salt (sodium) intake to 2,300 mg a day. If you have hypertension, you may need to reduce your sodium intake to 1,500 mg a day.  When on the DASH eating plan, aim to eat more fresh fruits and vegetables, whole grains, lean proteins, low-fat dairy, and heart-healthy fats.  Work with your health care provider or diet and nutrition specialist (dietitian) to adjust your eating plan to your individual calorie needs. This information is not intended to replace advice given to you by your health care provider. Make sure you discuss any questions you have with your health care provider. Document Released: 12/13/2010  Document Revised: 12/06/2016 Document Reviewed: 12/18/2015 Elsevier Patient Education  2020 Elsevier Inc.   Low-Sodium Eating Plan Sodium, which is an element that makes up salt, helps you maintain a healthy balance of fluids in your body. Too much sodium can increase your blood pressure and cause fluid and waste to be held in your body. Your health care provider or dietitian may recommend following this plan if you have high blood pressure (hypertension), kidney disease, liver disease, or heart failure. Eating less sodium can help lower your blood pressure, reduce swelling, and protect your heart, liver, and kidneys. What are tips for following this plan? General guidelines  Most people on this plan should limit their sodium intake to 1,500-2,000 mg (milligrams) of sodium each day. Reading food labels   The Nutrition Facts label lists the amount of sodium in one serving of the food. If you eat more than one serving, you must multiply the listed amount of sodium by the number of servings.  Choose foods with less than 140 mg of sodium per serving.  Avoid foods with 300 mg of sodium or more per serving. Shopping  Look for lower-sodium products, often labeled as "low-sodium" or "no salt added."  Always check the sodium content even if foods are labeled as "unsalted" or "no salt added".  Buy fresh foods. ? Avoid canned foods and premade or frozen meals. ? Avoid canned, cured, or processed meats  Buy breads that have less than 80 mg of sodium per slice. Cooking  Eat more home-cooked food and less restaurant, buffet, and fast food.  Avoid adding salt when cooking. Use salt-free seasonings or   herbs instead of table salt or sea salt. Check with your health care provider or pharmacist before using salt substitutes.  Cook with plant-based oils, such as canola, sunflower, or olive oil. Meal planning  When eating at a restaurant, ask that your food be prepared with less salt or no salt,  if possible.  Avoid foods that contain MSG (monosodium glutamate). MSG is sometimes added to Chinese food, bouillon, and some canned foods. What foods are recommended? The items listed may not be a complete list. Talk with your dietitian about what dietary choices are best for you. Grains Low-sodium cereals, including oats, puffed wheat and rice, and shredded wheat. Low-sodium crackers. Unsalted rice. Unsalted pasta. Low-sodium bread. Whole-grain breads and whole-grain pasta. Vegetables Fresh or frozen vegetables. "No salt added" canned vegetables. "No salt added" tomato sauce and paste. Low-sodium or reduced-sodium tomato and vegetable juice. Fruits Fresh, frozen, or canned fruit. Fruit juice. Meats and other protein foods Fresh or frozen (no salt added) meat, poultry, seafood, and fish. Low-sodium canned tuna and salmon. Unsalted nuts. Dried peas, beans, and lentils without added salt. Unsalted canned beans. Eggs. Unsalted nut butters. Dairy Milk. Soy milk. Cheese that is naturally low in sodium, such as ricotta cheese, fresh mozzarella, or Swiss cheese Low-sodium or reduced-sodium cheese. Cream cheese. Yogurt. Fats and oils Unsalted butter. Unsalted margarine with no trans fat. Vegetable oils such as canola or olive oils. Seasonings and other foods Fresh and dried herbs and spices. Salt-free seasonings. Low-sodium mustard and ketchup. Sodium-free salad dressing. Sodium-free light mayonnaise. Fresh or refrigerated horseradish. Lemon juice. Vinegar. Homemade, reduced-sodium, or low-sodium soups. Unsalted popcorn and pretzels. Low-salt or salt-free chips. What foods are not recommended? The items listed may not be a complete list. Talk with your dietitian about what dietary choices are best for you. Grains Instant hot cereals. Bread stuffing, pancake, and biscuit mixes. Croutons. Seasoned rice or pasta mixes. Noodle soup cups. Boxed or frozen macaroni and cheese. Regular salted crackers.  Self-rising flour. Vegetables Sauerkraut, pickled vegetables, and relishes. Olives. French fries. Onion rings. Regular canned vegetables (not low-sodium or reduced-sodium). Regular canned tomato sauce and paste (not low-sodium or reduced-sodium). Regular tomato and vegetable juice (not low-sodium or reduced-sodium). Frozen vegetables in sauces. Meats and other protein foods Meat or fish that is salted, canned, smoked, spiced, or pickled. Bacon, ham, sausage, hotdogs, corned beef, chipped beef, packaged lunch meats, salt pork, jerky, pickled herring, anchovies, regular canned tuna, sardines, salted nuts. Dairy Processed cheese and cheese spreads. Cheese curds. Blue cheese. Feta cheese. String cheese. Regular cottage cheese. Buttermilk. Canned milk. Fats and oils Salted butter. Regular margarine. Ghee. Bacon fat. Seasonings and other foods Onion salt, garlic salt, seasoned salt, table salt, and sea salt. Canned and packaged gravies. Worcestershire sauce. Tartar sauce. Barbecue sauce. Teriyaki sauce. Soy sauce, including reduced-sodium. Steak sauce. Fish sauce. Oyster sauce. Cocktail sauce. Horseradish that you find on the shelf. Regular ketchup and mustard. Meat flavorings and tenderizers. Bouillon cubes. Hot sauce and Tabasco sauce. Premade or packaged marinades. Premade or packaged taco seasonings. Relishes. Regular salad dressings. Salsa. Potato and tortilla chips. Corn chips and puffs. Salted popcorn and pretzels. Canned or dried soups. Pizza. Frozen entrees and pot pies. Summary  Eating less sodium can help lower your blood pressure, reduce swelling, and protect your heart, liver, and kidneys.  Most people on this plan should limit their sodium intake to 1,500-2,000 mg (milligrams) of sodium each day.  Canned, boxed, and frozen foods are high in sodium. Restaurant foods, fast foods,   and pizza are also very high in sodium. You also get sodium by adding salt to food.  Try to cook at home, eat  more fresh fruits and vegetables, and eat less fast food, canned, processed, or prepared foods. This information is not intended to replace advice given to you by your health care provider. Make sure you discuss any questions you have with your health care provider. Document Released: 06/15/2001 Document Revised: 12/06/2016 Document Reviewed: 12/18/2015 Elsevier Patient Education  2020 [employer]     Why follow it? Research shows. . Those who follow the Mediterranean diet have a reduced risk of heart disease  . The diet is associated with a reduced incidence of Parkinson's and Alzheimer's diseases . People following the diet may have longer life expectancies and lower rates of chronic diseases  . The Dietary Guidelines for Americans recommends the Mediterranean diet as an eating plan to promote health and prevent disease  What Is the Mediterranean Diet?  . Healthy eating plan based on typical foods and recipes of Mediterranean-style cooking . The diet is primarily a plant based diet; these foods should make up a majority of meals   Starches - Plant based foods should make up a majority of meals - They are an important sources of vitamins, minerals, energy, antioxidants, and fiber - Choose whole grains, foods high in fiber and minimally processed items  - Typical grain sources include wheat, oats, barley, corn, brown rice, bulgar, farro, millet, polenta, couscous  - Various types of beans include chickpeas, lentils, fava beans, black beans, white beans   Fruits  Veggies - Large quantities of antioxidant rich fruits & veggies; 6 or more servings  - Vegetables can be eaten raw or lightly drizzled with oil and cooked  - Vegetables common to the traditional Mediterranean Diet include: artichokes, arugula, beets, broccoli, brussel sprouts, cabbage, carrots, celery, collard greens, cucumbers, eggplant, kale, leeks, lemons, lettuce, mushrooms, okra, onions, peas, peppers, potatoes, pumpkin,  radishes, rutabaga, shallots, spinach, sweet potatoes, turnips, zucchini - Fruits common to the Mediterranean Diet include: apples, apricots, avocados, cherries, clementines, dates, figs, grapefruits, grapes, melons, nectarines, oranges, peaches, pears, pomegranates, strawberries, tangerines  Fats - Replace butter and margarine with healthy oils, such as olive oil, canola oil, and tahini  - Limit nuts to no more than a handful a day  - Nuts include walnuts, almonds, pecans, pistachios, pine nuts  - Limit or avoid candied, honey roasted or heavily salted nuts - Olives are central to the Mediterranean diet - can be eaten whole or used in a variety of dishes   Meats Protein - Limiting red meat: no more than a few times a month - When eating red meat: choose lean cuts and keep the portion to the size of deck of cards - Eggs: approx. 0 to 4 times a week  - Fish and lean poultry: at least 2 a week  - Healthy protein sources include, chicken, turkey, lean beef, lamb - Increase intake of seafood such as tuna, salmon, trout, mackerel, shrimp, scallops - Avoid or limit high fat processed meats such as sausage and bacon  Dairy - Include moderate amounts of low fat dairy products  - Focus on healthy dairy such as fat free yogurt, skim milk, low or reduced fat cheese - Limit dairy products higher in fat such as whole or 2% milk, cheese, ice cream  Alcohol - Moderate amounts of red wine is ok  - No more than 5 oz daily for women (all ages)   and men older than age 65  - No more than 10 oz of wine daily for men younger than 65  Other - Limit sweets and other desserts  - Use herbs and spices instead of salt to flavor foods  - Herbs and spices common to the traditional Mediterranean Diet include: basil, bay leaves, chives, cloves, cumin, fennel, garlic, lavender, marjoram, mint, oregano, parsley, pepper, rosemary, sage, savory, sumac, tarragon, thyme   It's not just a diet, it's a lifestyle:  . The  Mediterranean diet includes lifestyle factors typical of those in the region  . Foods, drinks and meals are best eaten with others and savored . Daily physical activity is important for overall good health . This could be strenuous exercise like running and aerobics . This could also be more leisurely activities such as walking, housework, yard-work, or taking the stairs . Moderation is the key; a balanced and healthy diet accommodates most foods and drinks . Consider portion sizes and frequency of consumption of certain foods   Meal Ideas & Options:  . Breakfast:  o Whole wheat toast or whole wheat English muffins with peanut butter & hard boiled egg o Steel cut oats topped with apples & cinnamon and skim milk  o Fresh fruit: banana, strawberries, melon, berries, peaches  o Smoothies: strawberries, bananas, greek yogurt, peanut butter o Low fat greek yogurt with blueberries and granola  o Egg white omelet with spinach and mushrooms o Breakfast couscous: whole wheat couscous, apricots, skim milk, cranberries  . Sandwiches:  o Hummus and grilled vegetables (peppers, zucchini, squash) on whole wheat bread   o Grilled chicken on whole wheat pita with lettuce, tomatoes, cucumbers or tzatziki  o Tuna salad on whole wheat bread: tuna salad made with greek yogurt, olives, red peppers, capers, green onions o Garlic rosemary lamb pita: lamb sauted with garlic, rosemary, salt & pepper; add lettuce, cucumber, greek yogurt to pita - flavor with lemon juice and black pepper  . Seafood:  o Mediterranean grilled salmon, seasoned with garlic, basil, parsley, lemon juice and black pepper o Shrimp, lemon, and spinach whole-grain pasta salad made with low fat greek yogurt  o Seared scallops with lemon orzo  o Seared tuna steaks seasoned salt, pepper, coriander topped with tomato mixture of olives, tomatoes, olive oil, minced garlic, parsley, green onions and cappers  . Meats:  o Herbed greek chicken salad  with kalamata olives, cucumber, feta  o Red bell peppers stuffed with spinach, bulgur, lean ground beef (or lentils) & topped with feta   o Kebabs: skewers of chicken, tomatoes, onions, zucchini, squash  o Turkey burgers: made with red onions, mint, dill, lemon juice, feta cheese topped with roasted red peppers . Vegetarian o Cucumber salad: cucumbers, artichoke hearts, celery, red onion, feta cheese, tossed in olive oil & lemon juice  o Hummus and whole grain pita points with a greek salad (lettuce, tomato, feta, olives, cucumbers, red onion) o Lentil soup with celery, carrots made with vegetable broth, garlic, salt and pepper  o Tabouli salad: parsley, bulgur, mint, scallions, cucumbers, tomato, radishes, lemon juice, olive oil, salt and pepper.      

## 2018-09-14 ENCOUNTER — Encounter: Payer: Self-pay | Admitting: Registered Nurse

## 2018-09-15 ENCOUNTER — Ambulatory Visit: Payer: BC Managed Care – PPO

## 2018-09-18 ENCOUNTER — Ambulatory Visit (INDEPENDENT_AMBULATORY_CARE_PROVIDER_SITE_OTHER): Payer: BC Managed Care – PPO | Admitting: Registered Nurse

## 2018-09-18 ENCOUNTER — Other Ambulatory Visit: Payer: Self-pay

## 2018-09-18 VITALS — BP 126/70 | HR 75

## 2018-09-18 DIAGNOSIS — I10 Essential (primary) hypertension: Secondary | ICD-10-CM

## 2018-09-18 NOTE — Patient Instructions (Signed)
° ° ° °  If you have lab work done today you will be contacted with your lab results within the next 2 weeks.  If you have not heard from us then please contact us. The fastest way to get your results is to register for My Chart. ° ° °IF you received an x-ray today, you will receive an invoice from [facility]. Please contact  Radiology at [phone] with questions or concerns regarding your invoice.  ° °IF you received labwork today, you will receive an invoice from [facility]. Please contact [facility] at [phone] with questions or concerns regarding your invoice.  ° °Our billing staff will not be able to assist you with questions regarding bills from these companies. ° °You will be contacted with the lab results as soon as they are available. The fastest way to get your results is to activate your My Chart account. Instructions are located on the last page of this paperwork. If you have not heard from us regarding the results in 2 weeks, please contact this office. °  ° ° ° °

## 2018-09-24 ENCOUNTER — Other Ambulatory Visit: Payer: Self-pay

## 2018-09-24 ENCOUNTER — Ambulatory Visit (INDEPENDENT_AMBULATORY_CARE_PROVIDER_SITE_OTHER): Payer: BC Managed Care – PPO

## 2018-09-24 DIAGNOSIS — B373 Candidiasis of vulva and vagina: Secondary | ICD-10-CM | POA: Diagnosis not present

## 2018-09-24 DIAGNOSIS — N898 Other specified noninflammatory disorders of vagina: Secondary | ICD-10-CM | POA: Diagnosis not present

## 2018-09-24 DIAGNOSIS — Z113 Encounter for screening for infections with a predominantly sexual mode of transmission: Secondary | ICD-10-CM

## 2018-09-24 NOTE — Progress Notes (Signed)
SUBJECTIVE:  48 y.o. female complains of white vaginal discharge. Denies abnormal vaginal bleeding or significant pelvic pain or fever. No UTI symptoms. Denies history of known exposure to STD.  OBJECTIVE:  She appears well, afebrile. Urine dipstick: not done.  ASSESSMENT:  Vaginal Discharge  Vaginal Odor   PLAN:  GC, chlamydia, trichomonas, BVAG, CVAG probe sent to lab. Treatment: To be determined once lab results are received ROV prn if symptoms persist or worsen.

## 2018-09-24 NOTE — Progress Notes (Signed)
Agree with A & P. 

## 2018-09-25 LAB — CERVICOVAGINAL ANCILLARY ONLY
Bacterial Vaginitis (gardnerella): NEGATIVE
Candida Glabrata: NEGATIVE
Candida Vaginitis: POSITIVE — AB
Molecular Disclaimer: NEGATIVE
Molecular Disclaimer: NEGATIVE
Molecular Disclaimer: NEGATIVE
Molecular Disclaimer: NORMAL
Trichomonas: NEGATIVE

## 2018-09-26 ENCOUNTER — Other Ambulatory Visit: Payer: Self-pay | Admitting: Obstetrics and Gynecology

## 2018-09-26 DIAGNOSIS — B3731 Acute candidiasis of vulva and vagina: Secondary | ICD-10-CM

## 2018-09-26 DIAGNOSIS — B373 Candidiasis of vulva and vagina: Secondary | ICD-10-CM

## 2018-09-26 LAB — CERVICOVAGINAL ANCILLARY ONLY
Chlamydia: NEGATIVE
Neisseria Gonorrhea: NEGATIVE

## 2018-09-26 MED ORDER — FLUCONAZOLE 150 MG PO TABS: 150.0000 mg | ORAL_TABLET | Freq: Once | ORAL | 2 refills | Status: AC

## 2018-09-26 MED ORDER — FLUCONAZOLE 150 MG PO TABS: 150.0000 mg | ORAL_TABLET | Freq: Once | ORAL | 3 refills | Status: DC

## 2018-10-07 ENCOUNTER — Encounter: Payer: Self-pay | Admitting: Registered Nurse

## 2018-10-14 ENCOUNTER — Encounter: Payer: Self-pay | Admitting: Registered Nurse

## 2018-10-14 ENCOUNTER — Other Ambulatory Visit: Payer: Self-pay

## 2018-10-14 ENCOUNTER — Ambulatory Visit (INDEPENDENT_AMBULATORY_CARE_PROVIDER_SITE_OTHER): Payer: BC Managed Care – PPO | Admitting: Registered Nurse

## 2018-10-14 VITALS — BP 120/78 | HR 101 | Temp 98.6°F | Resp 16 | Wt 206.0 lb

## 2018-10-14 DIAGNOSIS — M25561 Pain in right knee: Secondary | ICD-10-CM | POA: Diagnosis not present

## 2018-10-14 NOTE — Patient Instructions (Signed)
° ° ° °  If you have lab work done today you will be contacted with your lab results within the next 2 weeks.  If you have not heard from us then please contact us. The fastest way to get your results is to register for My Chart. ° ° °IF you received an x-ray today, you will receive an invoice from [facility]. Please contact [facility] at [phone] with questions or concerns regarding your invoice.  ° °IF you received labwork today, you will receive an invoice from [facility]. Please contact [facility] at [phone] with questions or concerns regarding your invoice.  ° °Our billing staff will not be able to assist you with questions regarding bills from these companies. ° °You will be contacted with the lab results as soon as they are available. The fastest way to get your results is to activate your My Chart account. Instructions are located on the last page of this paperwork. If you have not heard from us regarding the results in 2 weeks, please contact this office. °  ° ° ° °

## 2018-10-15 ENCOUNTER — Telehealth: Payer: Self-pay

## 2018-10-15 DIAGNOSIS — M25562 Pain in left knee: Secondary | ICD-10-CM

## 2018-10-15 DIAGNOSIS — G8929 Other chronic pain: Secondary | ICD-10-CM

## 2018-10-15 NOTE — Telephone Encounter (Signed)
Spoke with pt and she states she was told she would get a Rx for meloxcam until she see the PT. She states she would like a 30 day Rx. Please advise

## 2018-10-16 MED ORDER — MELOXICAM 7.5 MG PO TABS: 7.5000 mg | ORAL_TABLET | Freq: Every day | ORAL | 0 refills | Status: DC

## 2018-10-16 NOTE — Addendum Note (Signed)
Addended by: Maximiano Coss on: 10/16/2018 08:06 AM   Modules accepted: Orders

## 2018-10-18 ENCOUNTER — Encounter: Payer: Self-pay | Admitting: Registered Nurse

## 2018-10-18 NOTE — Progress Notes (Signed)
Acute Office Visit  Subjective:    Patient ID: Angela Brooks, female    DOB: [birth year], 48 y.o.   MRN: [id number]  Chief Complaint  Patient presents with  . Knee Pain    right knee pain for months    HPI Patient is in today for R knee pain  We have discussed this issue with her in the past. She has considered options of PT and Orthopedic surgery for joint injections - she is ready to move forward with these options, but is interested in a meloxicam refill in the mean time.   Pain is located in R knee along joint line on medial and lateral edges. Denies anterior or posterior pain. Denies instability. Denies numbness, weakness, or tingling in extremity. Denies swelling or notable effusion. Pain is achy, worse after work.   Past Medical History:  Diagnosis Date  . Allergy   . Anemia   . Anxiety   . Depression   . GERD (gastroesophageal reflux disease)   . Hypertension    Stopped taking meds x 1 year ago    Past Surgical History:  Procedure Laterality Date  . ABDOMINAL HYSTERECTOMY  07/29/2018  . CHOLECYSTECTOMY    . VAGINAL HYSTERECTOMY Right 07/29/2018   Procedure: HYSTERECTOMY VAGINAL Right  SALPINGECTOMY;  Surgeon: Chancy Milroy, MD;  Location: [facility];  Service: Gynecology;  Laterality: Right;  . WISDOM TOOTH EXTRACTION      Family History  Problem Relation Age of Onset  . Hypertension Mother   . Diabetes Maternal Grandmother     Social History   Socioeconomic History  . Marital status: Single    Spouse name: Not on file  . Number of children: Not on file  . Years of education: Not on file  . Highest education level: Not on file  Occupational History  . Not on file  Social Needs  . Financial resource strain: Not on file  . Food insecurity    Worry: Not on file    Inability: Not on file  . Transportation needs    Medical: Not on file    Non-medical: Not on file  Tobacco Use  . Smoking status: Never Smoker  . Smokeless tobacco: Never Used  Substance  and Sexual Activity  . Alcohol use: No    Alcohol/week: 0.0 standard drinks  . Drug use: No  . Sexual activity: Yes    Birth control/protection: None  Lifestyle  . Physical activity    Days per week: Not on file    Minutes per session: Not on file  . Stress: Not on file  Relationships  . Social Herbalist on phone: Not on file    Gets together: Not on file    Attends religious service: Not on file    Active member of club or organization: Not on file    Attends meetings of clubs or organizations: Not on file    Relationship status: Not on file  . Intimate partner violence    Fear of current or ex partner: Not on file    Emotionally abused: Not on file    Physically abused: Not on file    Forced sexual activity: Not on file  Other Topics Concern  . Not on file  Social History Narrative  . Not on file    Outpatient Medications Prior to Visit  Medication Sig Dispense Refill  . acetaminophen (TYLENOL) 500 MG tablet Take 1,000 mg by mouth every 6 (six) hours as needed.    Marland Kitchen  ALPRAZolam (XANAX) 0.5 MG tablet Take 1 tablet (0.5 mg total) by mouth 3 (three) times daily as needed for anxiety. 90 tablet 1  . Ascorbic Acid (VITAMIN C) 1000 MG tablet Take 1,000 mg by mouth daily.    . hydrochlorothiazide (HYDRODIURIL) 25 MG tablet Take 1 tablet (25 mg total) by mouth daily. 90 tablet 1  . ibuprofen (ADVIL) 800 MG tablet Take 1 tablet (800 mg total) by mouth every 8 (eight) hours as needed. 30 tablet 0  . Multiple Vitamin (MULTIVITAMIN WITH MINERALS) TABS tablet Take 2 tablets by mouth daily.    . Nutritional Supplements (IMMUNE ENHANCE) TABS Take by mouth.     No facility-administered medications prior to visit.     No Known Allergies  Review of Systems  Constitutional: Negative.   HENT: Negative.   Eyes: Negative.   Respiratory: Negative.   Cardiovascular: Negative.   Gastrointestinal: Negative.   Genitourinary: Negative.   Musculoskeletal: Positive for joint pain  (R knee). Negative for back pain, falls, myalgias and neck pain.  Skin: Negative.   Neurological: Negative.   Endo/Heme/Allergies: Negative.   Psychiatric/Behavioral: Negative.   All other systems reviewed and are negative.      Objective:    Physical Exam  Constitutional: She is oriented to person, place, and time. She appears well-developed and well-nourished. No distress.  Pulmonary/Chest: Effort normal. No respiratory distress.  Musculoskeletal: Normal range of motion.        General: Tenderness (r knee medial and lateral joint line) present. No deformity or edema.  Neurological: She is alert and oriented to person, place, and time.  Skin: Skin is warm and dry. No rash noted. She is not diaphoretic. No erythema. No pallor.  Psychiatric: She has a normal mood and affect. Her behavior is normal. Judgment and thought content normal.  Nursing note and vitals reviewed.   BP 120/78   Pulse (!) 101   Temp 98.6 F (37 C) (Oral)   Resp 16   Wt 206 lb (93.4 kg)   LMP 07/02/2018 (Exact Date)   SpO2 98%   BMI 32.52 kg/m  Wt Readings from Last 3 Encounters:  10/14/18 206 lb (93.4 kg)  09/01/18 204 lb (92.5 kg)  08/19/18 199 lb 9.6 oz (90.5 kg)    Health Maintenance Due  Topic Date Due  . PAP SMEAR-Modifier  02/06/2017    There are no preventive care reminders to display for this patient.   Lab Results  Component Value Date   TSH 1.520 04/17/2017   Lab Results  Component Value Date   WBC 15.1 (H) 07/30/2018   HGB 7.4 (L) 07/30/2018   HCT 26.5 (L) 07/30/2018   MCV 65.1 (L) 07/30/2018   PLT 196 07/30/2018   Lab Results  Component Value Date   NA 133 (L) 07/30/2018   K 4.2 07/30/2018   CO2 26 07/30/2018   GLUCOSE 104 (H) 07/30/2018   BUN 11 07/30/2018   CREATININE 0.86 07/30/2018   BILITOT 1.0 12/29/2017   ALKPHOS 53 12/29/2017   AST 19 12/29/2017   ALT 19 12/29/2017   PROT 8.0 12/29/2017   ALBUMIN 4.0 12/29/2017   CALCIUM 9.2 07/30/2018   ANIONGAP 8  07/30/2018   Lab Results  Component Value Date   CHOL 141 02/06/2014   Lab Results  Component Value Date   HDL 48 02/06/2014   Lab Results  Component Value Date   LDLCALC 83 02/06/2014   Lab Results  Component Value Date   TRIG 49  02/06/2014   Lab Results  Component Value Date   CHOLHDL 2.9 02/06/2014   Lab Results  Component Value Date   HGBA1C 5.5 04/17/2017       Assessment & Plan:   Problem List Items Addressed This Visit    None    Visit Diagnoses    Recurrent pain of right knee    -  Primary   Relevant Orders   Ambulatory referral to Physical Therapy   Ambulatory referral to Orthopedic Surgery       No orders of the defined types were placed in this encounter.  PLAN  Referrals to PT and Ortho as requested  Meloxicam 7.5mg  PO qd PRN for pain given 30 tabs  RTC PRN  Patient encouraged to call clinic with any questions, comments, or concerns.    Maximiano Coss, NP

## 2018-10-28 ENCOUNTER — Ambulatory Visit (INDEPENDENT_AMBULATORY_CARE_PROVIDER_SITE_OTHER): Payer: BC Managed Care – PPO | Admitting: Orthopaedic Surgery

## 2018-10-28 ENCOUNTER — Ambulatory Visit: Payer: Self-pay

## 2018-10-28 ENCOUNTER — Encounter: Payer: Self-pay | Admitting: Orthopaedic Surgery

## 2018-10-28 ENCOUNTER — Other Ambulatory Visit: Payer: Self-pay

## 2018-10-28 DIAGNOSIS — M25561 Pain in right knee: Secondary | ICD-10-CM

## 2018-10-28 DIAGNOSIS — M25562 Pain in left knee: Secondary | ICD-10-CM

## 2018-10-28 DIAGNOSIS — G8929 Other chronic pain: Secondary | ICD-10-CM

## 2018-10-28 MED ORDER — METHYLPREDNISOLONE ACETATE 40 MG/ML IJ SUSP
13.3300 mg | INTRAMUSCULAR | Status: AC | PRN
  Administered 2018-10-28: 13.33 mg via INTRA_ARTICULAR

## 2018-10-28 MED ORDER — METHYLPREDNISOLONE ACETATE 40 MG/ML IJ SUSP
13.3300 mg | INTRAMUSCULAR | Status: AC | PRN
  Administered 2018-10-28: 17:00:00 13.33 mg via INTRA_ARTICULAR

## 2018-10-28 MED ORDER — BUPIVACAINE HCL 0.25 % IJ SOLN
0.6600 mL | INTRAMUSCULAR | Status: AC | PRN
  Administered 2018-10-28: 17:00:00 .66 mL via INTRA_ARTICULAR

## 2018-10-28 MED ORDER — LIDOCAINE HCL 1 % IJ SOLN
3.0000 mL | INTRAMUSCULAR | Status: AC | PRN
  Administered 2018-10-28: 3 mL

## 2018-10-28 MED ORDER — LIDOCAINE HCL 1 % IJ SOLN
3.0000 mL | INTRAMUSCULAR | Status: AC | PRN
  Administered 2018-10-28: 17:00:00 3 mL

## 2018-10-28 NOTE — Progress Notes (Signed)
Office Visit Note   Patient: Angela Brooks           Date of Birth: [birth year]           MRN: [id number] Visit Date: 10/28/2018              Requested by: Maximiano Coss, NP [place],  [place] [address] PCP: Maximiano Coss, NP   Assessment & Plan: Visit Diagnoses:  1. Chronic pain of both knees     Plan: Impression is bilateral knee early arthritis flareups.  We will inject both knees with cortisone today.  She will follow-up with [place] as needed.  Call with concerns or questions in meantime.  Follow-Up Instructions: Return if symptoms worsen or fail to improve.   Orders:  Orders Placed This Encounter  Procedures  . Large Joint Inj: bilateral knee  . XR KNEE 3 VIEW LEFT  . XR KNEE 3 VIEW RIGHT   No orders of the defined types were placed in this encounter.     Procedures: Large Joint Inj: bilateral knee on 10/28/2018 5:13 PM Indications: pain Details: 22 G needle, anterolateral approach Medications (Right): 0.66 mL bupivacaine 0.25 %; 3 mL lidocaine 1 %; 13.33 mg methylPREDNISolone acetate 40 MG/ML Medications (Left): 0.66 mL bupivacaine 0.25 %; 3 mL lidocaine 1 %; 13.33 mg methylPREDNISolone acetate 40 MG/ML      Clinical Data: No additional findings.   Subjective: Chief Complaint  Patient presents with  . Left Knee - Pain  . Right Knee - Pain    HPI patient is a pleasant 48 year old female who presents to clinic today with bilateral knee pain right greater than left.  Her knees have been bothering her for the past few years and have recently worsened.  She denies any known injury.  She does note that she works at [employer] and stands on concrete floors on a daily basis.  The pain she has is to the entire aspect of both knees.  She really has minimal pain during the day as she takes anti-inflammatories on a daily basis.  Majority of her pain is at night and is described as a constant ache.  No mechanical symptoms or instability.  No previous cortisone or  viscosupplementation injections.  Review of Systems as detailed in HPI.  All others reviewed and are negative.   Objective: Vital Signs: LMP 07/02/2018 (Exact Date)   Physical Exam well-developed and well-nourished female in no acute distress.  Alert and oriented x3.  Ortho Exam examination of both knees reveals a trace effusion on the left.  No effusion on the right.  Range of motion 0 to 125 degrees.  No joint line tenderness.  Ligaments are stable.  She is neurovascularly intact distally.  Specialty Comments:  No specialty comments available.  Imaging: Xr Knee 3 View Left  Result Date: 10/28/2018 Mild to moderate joint space narrowing medial compartment  Xr Knee 3 View Right  Result Date: 10/28/2018 Mild to moderate joint space narrowing medial compartment    PMFS History: Patient Active Problem List   Diagnosis Date Noted  . Post-operative state 07/29/2018  . 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 Iron deficiency anemia due to chronic blood loss 04/17/2017  . Grief 09/28/2014  . Essential hypertension 09/28/2014  . Dental caries extending into pulp 02/06/2014  . GERD (gastroesophageal reflux disease) 05/14/2011   Past Medical History:  Diagnosis Date  . Allergy   . Anemia   . Anxiety   . Depression   . GERD (gastroesophageal  reflux disease)   . Hypertension    Stopped taking meds x 1 year ago    Family History  Problem Relation Age of Onset  . Hypertension Mother   . Diabetes Maternal Grandmother     Past Surgical History:  Procedure Laterality Date  . ABDOMINAL HYSTERECTOMY  07/29/2018  . CHOLECYSTECTOMY    . VAGINAL HYSTERECTOMY Right 07/29/2018   Procedure: HYSTERECTOMY VAGINAL Right  SALPINGECTOMY;  Surgeon: Chancy Milroy, MD;  Location: [facility];  Service: Gynecology;  Laterality: Right;  . WISDOM TOOTH EXTRACTION     Social History   Occupational History  . Not on file  Tobacco Use  . Smoking status: Never Smoker  . Smokeless tobacco: Never Used   Substance and Sexual Activity  . Alcohol use: No    Alcohol/week: 0.0 standard drinks  . Drug use: No  . Sexual activity: Yes    Birth control/protection: None

## 2019-01-20 ENCOUNTER — Other Ambulatory Visit: Payer: Self-pay | Admitting: Registered Nurse

## 2019-01-20 DIAGNOSIS — I10 Essential (primary) hypertension: Secondary | ICD-10-CM

## 2019-01-24 ENCOUNTER — Encounter: Payer: Self-pay | Admitting: Registered Nurse

## 2019-02-01 ENCOUNTER — Encounter: Payer: Self-pay | Admitting: Orthopaedic Surgery

## 2019-02-01 ENCOUNTER — Encounter: Payer: Self-pay | Admitting: Registered Nurse

## 2019-02-01 ENCOUNTER — Other Ambulatory Visit: Payer: Self-pay | Admitting: *Deleted

## 2019-02-01 DIAGNOSIS — I10 Essential (primary) hypertension: Secondary | ICD-10-CM

## 2019-02-01 MED ORDER — HYDROCHLOROTHIAZIDE 25 MG PO TABS: 25.0000 mg | ORAL_TABLET | Freq: Every day | ORAL | 0 refills | Status: DC

## 2019-02-01 NOTE — Telephone Encounter (Signed)
Yes thanks 

## 2019-02-03 ENCOUNTER — Ambulatory Visit (INDEPENDENT_AMBULATORY_CARE_PROVIDER_SITE_OTHER): Payer: BC Managed Care – PPO | Admitting: Orthopaedic Surgery

## 2019-02-03 ENCOUNTER — Encounter: Payer: Self-pay | Admitting: Orthopaedic Surgery

## 2019-02-03 ENCOUNTER — Other Ambulatory Visit: Payer: Self-pay

## 2019-02-03 DIAGNOSIS — M170 Bilateral primary osteoarthritis of knee: Secondary | ICD-10-CM | POA: Diagnosis not present

## 2019-02-03 MED ORDER — LIDOCAINE HCL 1 % IJ SOLN
3.0000 mL | INTRAMUSCULAR | Status: AC | PRN
  Administered 2019-02-03: 3 mL

## 2019-02-03 MED ORDER — METHYLPREDNISOLONE ACETATE 40 MG/ML IJ SUSP
13.3300 mg | INTRAMUSCULAR | Status: AC | PRN
  Administered 2019-02-03: 13.33 mg via INTRA_ARTICULAR

## 2019-02-03 MED ORDER — BUPIVACAINE HCL 0.25 % IJ SOLN
0.6600 mL | INTRAMUSCULAR | Status: AC | PRN
  Administered 2019-02-03: .66 mL via INTRA_ARTICULAR

## 2019-02-03 MED ORDER — BUPIVACAINE HCL 0.25 % IJ SOLN
0.6600 mL | INTRAMUSCULAR | Status: AC | PRN
  Administered 2019-02-03: 16:00:00 .66 mL via INTRA_ARTICULAR

## 2019-02-03 NOTE — Progress Notes (Signed)
Office Visit Note   Patient: Angela Brooks           Date of Birth: 1970/08/28           MRN: [id number] Visit Date: 02/03/2019              Requested by: Maximiano Coss, NP [place],   [id number] PCP: Maximiano Coss, NP   Assessment & Plan: Visit Diagnoses:  1. Bilateral primary osteoarthritis of knee     Plan: Impression is bilateral knee arthritis flareup.  We will reinject both knees with cortisone today.  Have also provided the patient with a viscosupplementation handout.  She will follow-up with [place] as needed.  Follow-Up Instructions: Return if symptoms worsen or fail to improve.   Orders:  Orders Placed This Encounter  Procedures  . Large Joint Inj: bilateral knee   No orders of the defined types were placed in this encounter.     Procedures: Large Joint Inj: bilateral knee on 02/03/2019 3:46 PM Indications: pain Details: 22 G needle, anterolateral approach Medications (Right): 0.66 mL bupivacaine 0.25 %; 3 mL lidocaine 1 %; 13.33 mg methylPREDNISolone acetate 40 MG/ML Medications (Left): 0.66 mL bupivacaine 0.25 %; 3 mL lidocaine 1 %; 13.33 mg methylPREDNISolone acetate 40 MG/ML      Clinical Data: No additional findings.   Subjective: Chief Complaint  Patient presents with  . Left Knee - Pain  . Right Knee - Pain    HPI patient is a pleasant 49 year old female who comes in today with recurrent bilateral knee pain right greater than left.  History of osteoarthritis to both sides.  We saw her a little over 3 months ago where both knees were injected with cortisone.  She has significant relief of symptoms for about 2 months.  Her symptoms then returned.  She thinks this may in part been due to increased activity over the holidays.  No new injury.  She describes this as a constant dull ache.  No mechanical symptoms.  Her pain is aggravated when going from a seated to standing position as well as when she is trying to sleep at night.  She takes  Tylenol and Mobic without significant relief of symptoms.  Review of Systems as detailed in HPI.  All others reviewed and are negative.   Objective: Vital Signs: LMP 07/02/2018 (Exact Date)   Physical Exam well-developed well-nourished female no acute distress.  Alert and oriented x3.  Ortho Exam examination of both knees shows no effusion.  Range of motion 0 to 120 degrees.  Medial joint line tenderness on the right.  No joint line tenderness on the left.  Mild patellofemoral crepitus.  Ligaments are stable.  She is neurovascular intact distally.  Specialty Comments:  No specialty comments available.  Imaging: No new imaging   PMFS History: Patient Active Problem List   Diagnosis Date Noted  . Post-operative state 07/29/2018  . 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 Iron deficiency anemia due to chronic blood loss 04/17/2017  . Grief 09/28/2014  . Essential hypertension 09/28/2014  . Dental caries extending into pulp 02/06/2014  . GERD (gastroesophageal reflux disease) 05/14/2011   Past Medical History:  Diagnosis Date  . Allergy   . Anemia   . Anxiety   . Depression   . GERD (gastroesophageal reflux disease)   . Hypertension    Stopped taking meds x 1 year ago    Family History  Problem Relation Age of Onset  . Hypertension Mother   .  Diabetes Maternal Grandmother     Past Surgical History:  Procedure Laterality Date  . ABDOMINAL HYSTERECTOMY  07/29/2018  . CHOLECYSTECTOMY    . VAGINAL HYSTERECTOMY Right 07/29/2018   Procedure: HYSTERECTOMY VAGINAL Right  SALPINGECTOMY;  Surgeon: Chancy Milroy, MD;  Location: [facility];  Service: Gynecology;  Laterality: Right;  . WISDOM TOOTH EXTRACTION     Social History   Occupational History  . Not on file  Tobacco Use  . Smoking status: Never Smoker  . Smokeless tobacco: Never Used  Substance and Sexual Activity  . Alcohol use: No    Alcohol/week: 0.0 standard drinks  . Drug use: No  . Sexual activity: Yes    Birth  control/protection: None

## 2019-03-31 ENCOUNTER — Other Ambulatory Visit: Payer: Self-pay

## 2019-03-31 ENCOUNTER — Encounter: Payer: Self-pay | Admitting: Registered Nurse

## 2019-03-31 ENCOUNTER — Ambulatory Visit (INDEPENDENT_AMBULATORY_CARE_PROVIDER_SITE_OTHER): Payer: Managed Care, Other (non HMO) | Admitting: Registered Nurse

## 2019-03-31 ENCOUNTER — Telehealth: Payer: Self-pay | Admitting: Registered Nurse

## 2019-03-31 DIAGNOSIS — I10 Essential (primary) hypertension: Secondary | ICD-10-CM

## 2019-03-31 MED ORDER — HYDROCHLOROTHIAZIDE 25 MG PO TABS: 25.0000 mg | ORAL_TABLET | Freq: Every day | ORAL | 3 refills | Status: DC

## 2019-03-31 NOTE — Telephone Encounter (Signed)
Patient is requesting a refill of the following medications: Requested Prescriptions   Pending Prescriptions Disp Refills  . hydrochlorothiazide (HYDRODIURIL) 25 MG tablet 30 tablet 0    Sig: Take 1 tablet (25 mg total) by mouth daily.

## 2019-03-31 NOTE — Telephone Encounter (Signed)
Sent Maximiano Coss a message letting him know the patient needs this medication refilled

## 2019-03-31 NOTE — Telephone Encounter (Signed)
Patient is was seen today needs her meds still not at pharmacy   What is the name of the medication? hydrochlorothiazide (HYDRODIURIL) 25 MG tablet    Have you contacted your pharmacy to request a refill  y  Which pharmacy would you like this sent to? [facility], [place].  [address] [place] [address]  Phone:  [phone] Fax:  [phone]    Patient notified that their request is being sent to the clinical staff for review and that they should receive a call once it is complete. If they do not receive a call within 72 hours they can check with their pharmacy or our office.

## 2019-03-31 NOTE — Patient Instructions (Signed)
° ° ° °  If you have lab work done today you will be contacted with your lab results within the next 2 weeks.  If you have not heard from us then please contact us. The fastest way to get your results is to register for My Chart. ° ° °IF you received an x-ray today, you will receive an invoice from [facility]. Please contact [facility] at [phone] with questions or concerns regarding your invoice.  ° °IF you received labwork today, you will receive an invoice from [facility]. Please contact [facility] at [phone] with questions or concerns regarding your invoice.  ° °Our billing staff will not be able to assist you with questions regarding bills from these companies. ° °You will be contacted with the lab results as soon as they are available. The fastest way to get your results is to activate your My Chart account. Instructions are located on the last page of this paperwork. If you have not heard from us regarding the results in 2 weeks, please contact this office. °  ° ° ° °

## 2019-04-13 ENCOUNTER — Other Ambulatory Visit: Payer: Self-pay

## 2019-04-13 ENCOUNTER — Ambulatory Visit (INDEPENDENT_AMBULATORY_CARE_PROVIDER_SITE_OTHER): Payer: Managed Care, Other (non HMO) | Admitting: Registered Nurse

## 2019-04-13 DIAGNOSIS — I10 Essential (primary) hypertension: Secondary | ICD-10-CM

## 2019-04-14 ENCOUNTER — Ambulatory Visit: Payer: Managed Care, Other (non HMO)

## 2019-04-23 ENCOUNTER — Encounter: Payer: Self-pay | Admitting: Registered Nurse

## 2019-04-23 NOTE — Progress Notes (Signed)
Established Patient Office Visit  Subjective:  Patient ID: Angela Brooks, female    DOB: [birth year]  Age: 49 y.o. MRN: [id number]  CC:  Chief Complaint  Patient presents with  . Medication Refill    hydrochlorothiazide    HPI [name] presents for hctz med refill Ran out about 1 week ago bp elevated, but denies headache visual changes shob doe chest pain dependent edema and claudication No further concerns at this time  Past Medical History:  Diagnosis Date  . Allergy   . Anemia   . Anxiety   . Depression   . GERD (gastroesophageal reflux disease)   . Hypertension    Stopped taking meds x 1 year ago    Past Surgical History:  Procedure Laterality Date  . ABDOMINAL HYSTERECTOMY  07/29/2018  . CHOLECYSTECTOMY    . VAGINAL HYSTERECTOMY Right 07/29/2018   Procedure: HYSTERECTOMY VAGINAL Right  SALPINGECTOMY;  Surgeon: Chancy Milroy, MD;  Location: [facility];  Service: Gynecology;  Laterality: Right;  . WISDOM TOOTH EXTRACTION      Family History  Problem Relation Age of Onset  . Hypertension Mother   . Diabetes Maternal Grandmother     Social History   Socioeconomic History  . Marital status: Single    Spouse name: Not on file  . Number of children: Not on file  . Years of education: Not on file  . Highest education level: Not on file  Occupational History  . Not on file  Tobacco Use  . Smoking status: Never Smoker  . Smokeless tobacco: Never Used  Substance and Sexual Activity  . Alcohol use: No    Alcohol/week: 0.0 standard drinks  . Drug use: No  . Sexual activity: Yes    Birth control/protection: None  Other Topics Concern  . Not on file  Social History Narrative  . Not on file   Social Determinants of Health   Financial Resource Strain:   . Difficulty of Paying Living Expenses:   Food Insecurity:   . Worried About Charity fundraiser in the Last Year:   . Arboriculturist in the Last Year:   Transportation Needs:   . Lexicographer (Medical):   Marland Kitchen Lack of Transportation (Non-Medical):   Physical Activity:   . Days of Exercise per Week:   . Minutes of Exercise per Session:   Stress:   . Feeling of Stress :   Social Connections:   . Frequency of Communication with Friends and Family:   . Frequency of Social Gatherings with Friends and Family:   . Attends Religious Services:   . Active Member of Clubs or Organizations:   . Attends Archivist Meetings:   Marland Kitchen Marital Status:   Intimate Partner Violence:   . Fear of Current or Ex-Partner:   . Emotionally Abused:   Marland Kitchen Physically Abused:   . Sexually Abused:     Outpatient Medications Prior to Visit  Medication Sig Dispense Refill  . acetaminophen (TYLENOL) 500 MG tablet Take 1,000 mg by mouth every 6 (six) hours as needed.    . ALPRAZolam (XANAX) 0.5 MG tablet Take 1 tablet (0.5 mg total) by mouth 3 (three) times daily as needed for anxiety. 90 tablet 1  . Ascorbic Acid (VITAMIN C) 1000 MG tablet Take 1,000 mg by mouth daily.    Marland Kitchen ibuprofen (ADVIL) 800 MG tablet Take 1 tablet (800 mg total) by mouth every 8 (eight) hours as needed. 30 tablet 0  .  meloxicam (MOBIC) 7.5 MG tablet Take 1 tablet (7.5 mg total) by mouth daily. 30 tablet 0  . Multiple Vitamin (MULTIVITAMIN WITH MINERALS) TABS tablet Take 2 tablets by mouth daily.    . Nutritional Supplements (IMMUNE ENHANCE) TABS Take by mouth.    . hydrochlorothiazide (HYDRODIURIL) 25 MG tablet Take 1 tablet (25 mg total) by mouth daily. 30 tablet 0   No facility-administered medications prior to visit.    No Known Allergies  ROS Review of Systems  Constitutional: Negative.   HENT: Negative.   Eyes: Negative.   Respiratory: Negative.   Cardiovascular: Negative.   Gastrointestinal: Negative.   Endocrine: Negative.   Genitourinary: Negative.   Musculoskeletal: Negative.   Skin: Negative.   Allergic/Immunologic: Negative.   Neurological: Negative.   Hematological: Negative.     Psychiatric/Behavioral: Negative.   All other systems reviewed and are negative.     Objective:    Physical Exam  Constitutional: She is oriented to person, place, and time. She appears well-developed and well-nourished. No distress.  Cardiovascular: Normal rate, regular rhythm and normal heart sounds. Exam reveals no gallop and no friction rub.  No murmur heard. Pulmonary/Chest: Effort normal. No respiratory distress.  Neurological: She is alert and oriented to person, place, and time.  Skin: Skin is warm and dry. No rash noted. She is not diaphoretic. No erythema. No pallor.  Psychiatric: She has a normal mood and affect. Her behavior is normal. Judgment and thought content normal.  Nursing note and vitals reviewed.   BP (!) 153/100   Pulse 67   Temp 97.7 F (36.5 C) (Temporal)   Ht 5\' 6"  (1.676 m)   Wt 204 lb 12.8 oz (92.9 kg)   LMP 07/02/2018 (Exact Date)   SpO2 97%   BMI 33.06 kg/m  Wt Readings from Last 3 Encounters:  03/31/19 204 lb 12.8 oz (92.9 kg)  10/14/18 206 lb (93.4 kg)  09/01/18 204 lb (92.5 kg)     Health Maintenance Due  Topic Date Due  . PAP SMEAR-Modifier  02/06/2017    There are no preventive care reminders to display for this patient.  Lab Results  Component Value Date   TSH 1.520 04/17/2017   Lab Results  Component Value Date   WBC 15.1 (H) 07/30/2018   HGB 7.4 (L) 07/30/2018   HCT 26.5 (L) 07/30/2018   MCV 65.1 (L) 07/30/2018   PLT 196 07/30/2018   Lab Results  Component Value Date   NA 133 (L) 07/30/2018   K 4.2 07/30/2018   CO2 26 07/30/2018   GLUCOSE 104 (H) 07/30/2018   BUN 11 07/30/2018   CREATININE 0.86 07/30/2018   BILITOT 1.0 12/29/2017   ALKPHOS 53 12/29/2017   AST 19 12/29/2017   ALT 19 12/29/2017   PROT 8.0 12/29/2017   ALBUMIN 4.0 12/29/2017   CALCIUM 9.2 07/30/2018   ANIONGAP 8 07/30/2018   Lab Results  Component Value Date   CHOL 141 02/06/2014   Lab Results  Component Value Date   HDL 48 02/06/2014    Lab Results  Component Value Date   LDLCALC 83 02/06/2014   Lab Results  Component Value Date   TRIG 49 02/06/2014   Lab Results  Component Value Date   CHOLHDL 2.9 02/06/2014   Lab Results  Component Value Date   HGBA1C 5.5 04/17/2017      Assessment & Plan:   Problem List Items Addressed This Visit      Cardiovascular and Mediastinum   Essential  hypertension   Relevant Medications   hydrochlorothiazide (HYDRODIURIL) 25 MG tablet      Meds ordered this encounter  Medications  . hydrochlorothiazide (HYDRODIURIL) 25 MG tablet    Sig: Take 1 tablet (25 mg total) by mouth daily.    Dispense:  90 tablet    Refill:  3    Order Specific Question:   Supervising Provider    Answer:   Forrest Moron [id number]    Follow-up: Return in about 2 weeks (around 04/14/2019) for bp chek nurse visit.   PLAN  Refill hctz  Return in 2 weeks for nurse visit bp check  Patient encouraged to call clinic with any questions, comments, or concerns.  Maximiano Coss, NP

## 2019-06-02 ENCOUNTER — Telehealth: Payer: Self-pay | Admitting: Orthopaedic Surgery

## 2019-06-02 NOTE — Telephone Encounter (Signed)
Yes

## 2019-06-02 NOTE — Telephone Encounter (Signed)
Patient would like to get BIL knee gel injections.  [phone].  Thank you.

## 2019-06-02 NOTE — Telephone Encounter (Signed)
Okay to submit for bil knee gel injs?

## 2019-06-03 NOTE — Telephone Encounter (Signed)
Please submit for bil knee injections.

## 2019-06-08 NOTE — Telephone Encounter (Signed)
Noted  

## 2019-06-10 ENCOUNTER — Telehealth: Payer: Self-pay

## 2019-06-10 ENCOUNTER — Ambulatory Visit: Payer: BC Managed Care – PPO | Admitting: Orthopaedic Surgery

## 2019-06-10 NOTE — Telephone Encounter (Signed)
Submitted VOB for Monovisc, bilateral knee. 

## 2019-06-16 ENCOUNTER — Telehealth: Payer: Self-pay

## 2019-06-16 NOTE — Telephone Encounter (Signed)
PA required for Monovisc, bilateral knee through [facility]. Faxed completed [employer] PA form to [phone].

## 2019-06-24 ENCOUNTER — Telehealth: Payer: Self-pay

## 2019-06-24 NOTE — Telephone Encounter (Signed)
Refaxed PA form for a Reconsideration for Monovisc, bilateral knee to [employer] at [phone].

## 2019-06-30 ENCOUNTER — Telehealth: Payer: Self-pay

## 2019-06-30 NOTE — Telephone Encounter (Signed)
Patient aware that she is approved for gel injection.  Approved, Monovisc, bilateral knee. [place] does not apply Covered at 100%, once OOP has been met Co-pay of $50.00 PA required PA Approval# [id number] Valid 06/24/2019 - 07/21/2019

## 2019-07-07 ENCOUNTER — Ambulatory Visit: Payer: Self-pay | Admitting: Orthopaedic Surgery

## 2019-07-07 ENCOUNTER — Ambulatory Visit: Payer: Managed Care, Other (non HMO) | Admitting: Orthopaedic Surgery

## 2019-07-07 ENCOUNTER — Encounter: Payer: Self-pay | Admitting: Orthopaedic Surgery

## 2019-07-07 DIAGNOSIS — M170 Bilateral primary osteoarthritis of knee: Secondary | ICD-10-CM | POA: Diagnosis not present

## 2019-07-07 MED ORDER — BUPIVACAINE HCL 0.5 % IJ SOLN
2.0000 mL | INTRAMUSCULAR | Status: AC | PRN
  Administered 2019-07-07: 2 mL via INTRA_ARTICULAR

## 2019-07-07 MED ORDER — HYALURONAN 88 MG/4ML IX SOSY
88.0000 mg | PREFILLED_SYRINGE | INTRA_ARTICULAR | Status: AC | PRN
  Administered 2019-07-07: 88 mg via INTRA_ARTICULAR

## 2019-07-07 MED ORDER — LIDOCAINE HCL 1 % IJ SOLN
2.0000 mL | INTRAMUSCULAR | Status: AC | PRN
  Administered 2019-07-07: 2 mL

## 2019-07-07 NOTE — Progress Notes (Signed)
   Procedure Note  Patient: Angela Brooks             Date of Birth: 11/04/1970           MRN: [id number]             Visit Date: 07/07/2019  Procedures: Visit Diagnoses:  1. Bilateral primary osteoarthritis of knee     Large Joint Inj: bilateral knee on 07/07/2019 1:59 PM Indications: pain Details: 22 G needle  Arthrogram: No  Medications (Right): 2 mL lidocaine 1 %; 2 mL bupivacaine 0.5 %; 88 mg Hyaluronan 88 MG/4ML Medications (Left): 2 mL lidocaine 1 %; 2 mL bupivacaine 0.5 %; 88 mg Hyaluronan 88 MG/4ML Outcome: tolerated well, no immediate complications Patient was prepped and draped in the usual sterile fashion.

## 2019-11-23 ENCOUNTER — Ambulatory Visit: Payer: Managed Care, Other (non HMO) | Admitting: Registered Nurse

## 2019-11-23 ENCOUNTER — Encounter: Payer: Self-pay | Admitting: Registered Nurse

## 2019-11-23 ENCOUNTER — Ambulatory Visit: Payer: Managed Care, Other (non HMO)

## 2019-11-23 ENCOUNTER — Ambulatory Visit
Admission: RE | Admit: 2019-11-23 | Discharge: 2019-11-23 | Disposition: A | Discharge status: Home / Self Care | Payer: Managed Care, Other (non HMO) | Source: Ambulatory Visit | Attending: Registered Nurse | Admitting: Registered Nurse

## 2019-11-23 ENCOUNTER — Other Ambulatory Visit: Payer: Self-pay

## 2019-11-23 VITALS — BP 154/88 | HR 85 | Temp 98.1°F | Resp 18 | Ht 65.0 in | Wt 200.8 lb

## 2019-11-23 DIAGNOSIS — S3141XA Laceration without foreign body of vagina and vulva, initial encounter: Secondary | ICD-10-CM | POA: Diagnosis not present

## 2019-11-23 DIAGNOSIS — Z23 Encounter for immunization: Secondary | ICD-10-CM

## 2019-11-23 DIAGNOSIS — Z1231 Encounter for screening mammogram for malignant neoplasm of breast: Secondary | ICD-10-CM | POA: Diagnosis not present

## 2019-11-23 DIAGNOSIS — Z1211 Encounter for screening for malignant neoplasm of colon: Secondary | ICD-10-CM | POA: Diagnosis not present

## 2019-11-23 NOTE — Patient Instructions (Signed)
° ° ° °  If you have lab work done today you will be contacted with your lab results within the next 2 weeks.  If you have not heard from us then please contact us. The fastest way to get your results is to register for My Chart. ° ° °IF you received an x-ray today, you will receive an invoice from [facility]. Please contact [facility] at [phone] with questions or concerns regarding your invoice.  ° °IF you received labwork today, you will receive an invoice from [facility]. Please contact [facility] at [phone] with questions or concerns regarding your invoice.  ° °Our billing staff will not be able to assist you with questions regarding bills from these companies. ° °You will be contacted with the lab results as soon as they are available. The fastest way to get your results is to activate your My Chart account. Instructions are located on the last page of this paperwork. If you have not heard from us regarding the results in 2 weeks, please contact this office. °  ° ° ° °

## 2019-12-21 LAB — COLOGUARD: Cologuard: NEGATIVE

## 2020-01-07 IMAGING — CT CT HEAD W/O CM
4 series · 15 of 47 positions shown, 17 images · non-contrast
Comparison: None.

CLINICAL DATA: 46 y/o  F; left-sided weakness.

EXAM:
CT HEAD WITHOUT CONTRAST
TECHNIQUE: Contiguous axial images were obtained from the base of the skull
through the vertex without intravenous contrast.

[Series 3: head wo · axial · 0.39mm/px · z∈[-164,-48]mm · 7 of 31 slices shown, 9 images]
[im 4/31  brain]
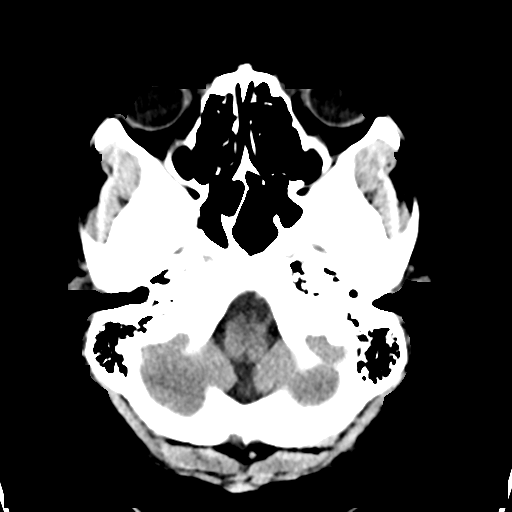
[im 4/31  bone]
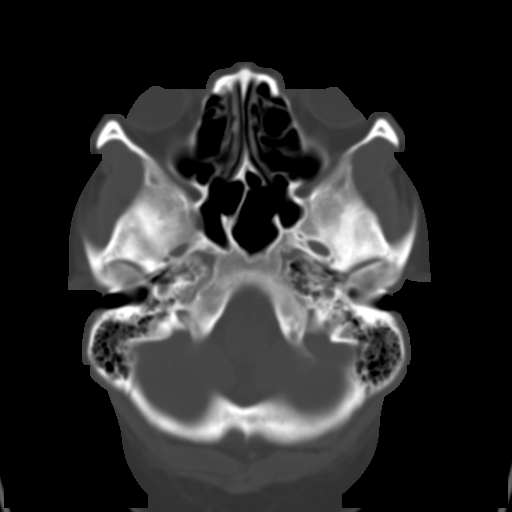
[im 8/31  brain]
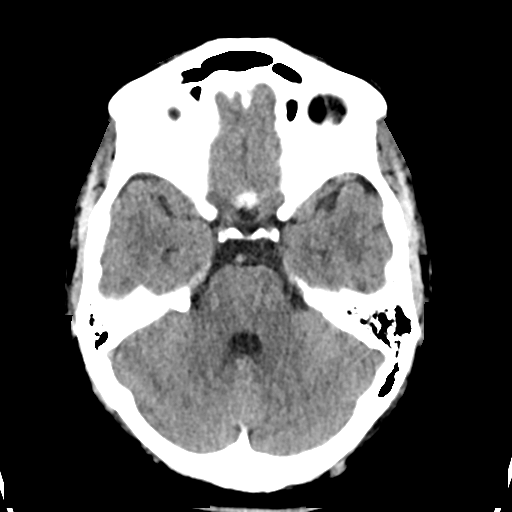
[im 12/31  brain]
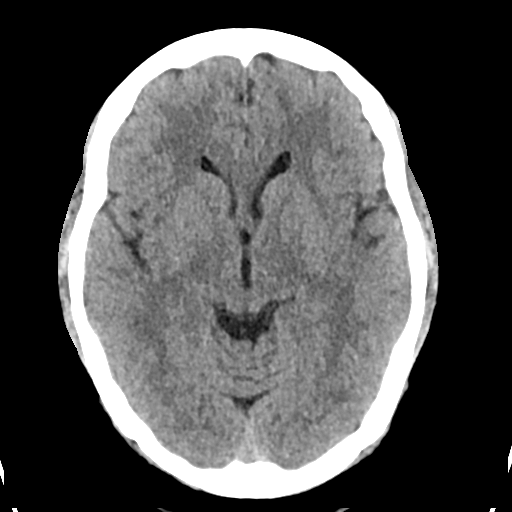
[im 16/31  brain]
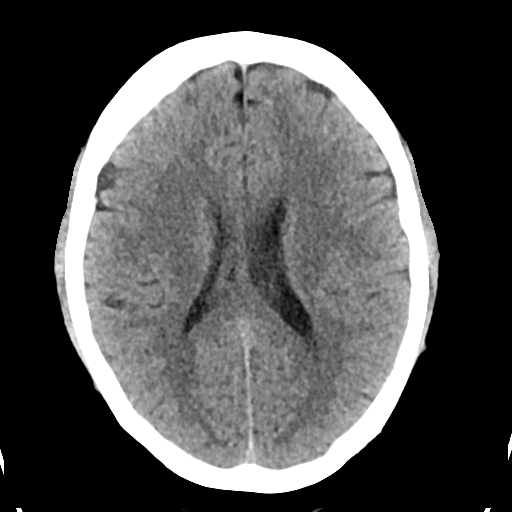
[im 19/31  brain]
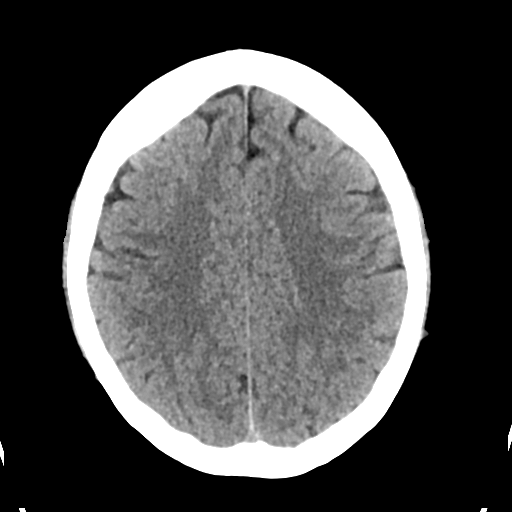
[im 19/31  bone]
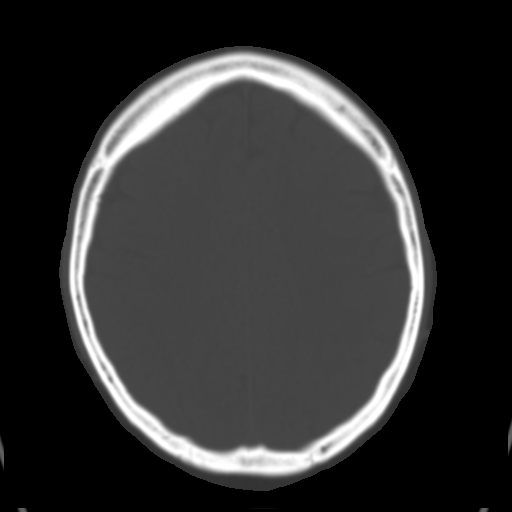
[im 23/31  brain]
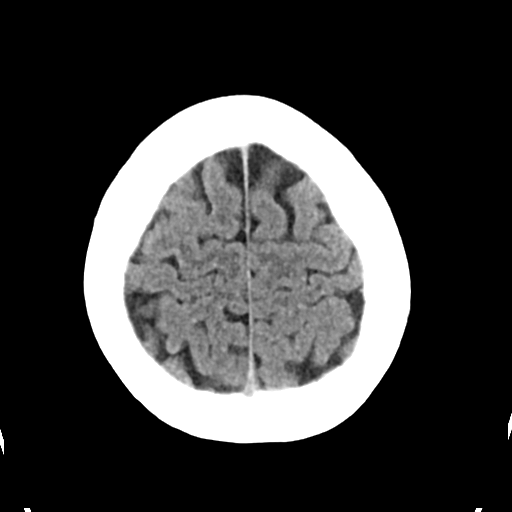
[im 27/31  brain]
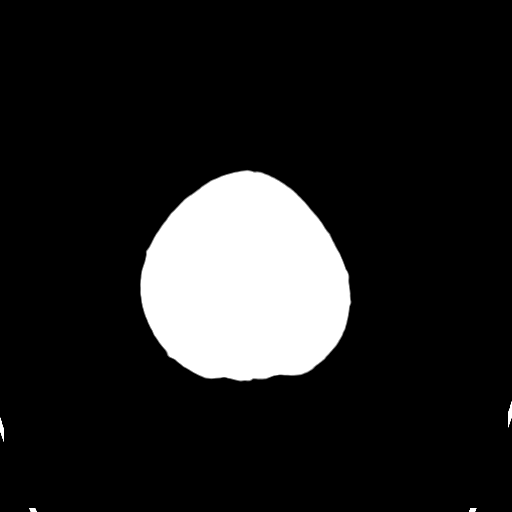

[Series 4: head bone · axial · 0.39mm/px · z∈[-164,-148]mm · 2 of 77 slices shown]
[im 8/77  bone]
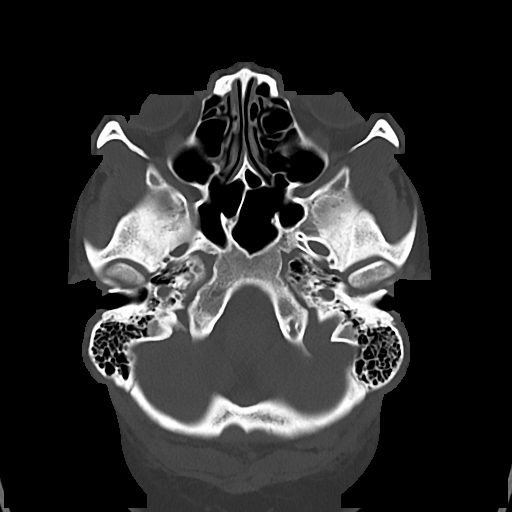
[im 16/77  bone]
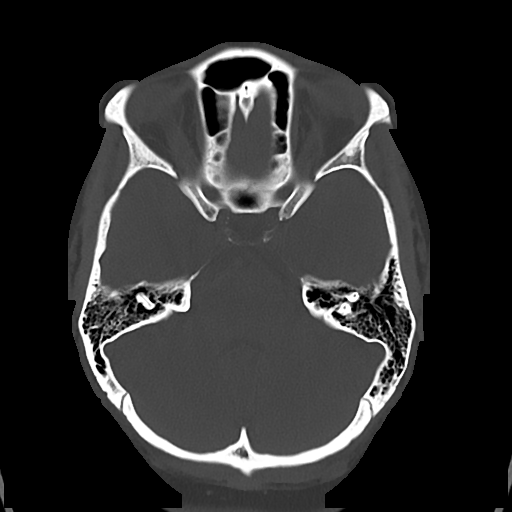

[Series 5: cor soft · coronal · 0.31mm/px · 3 of 60 slices shown]
[im 20/60  brain]
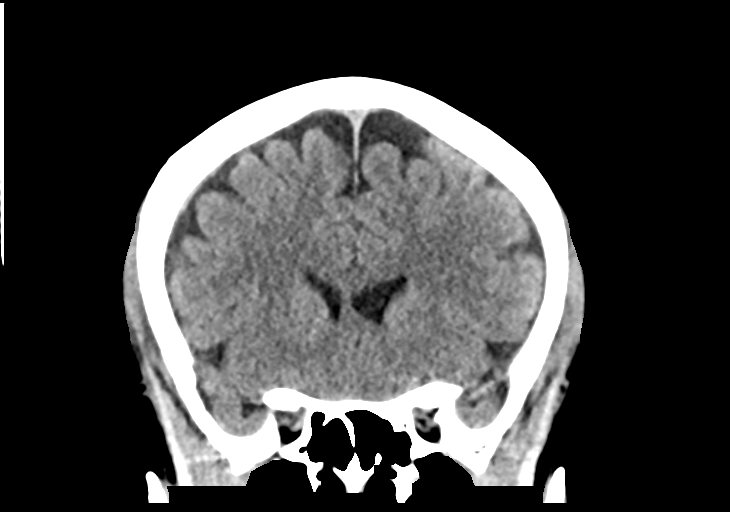
[im 27/60  brain]
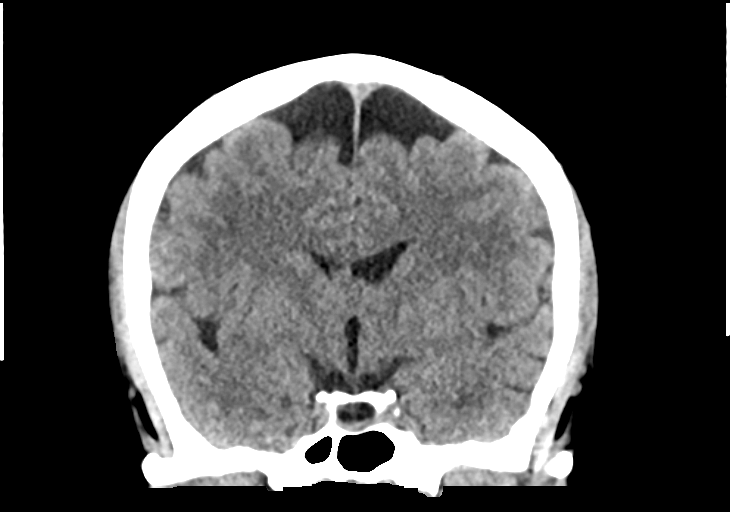
[im 33/60  brain]
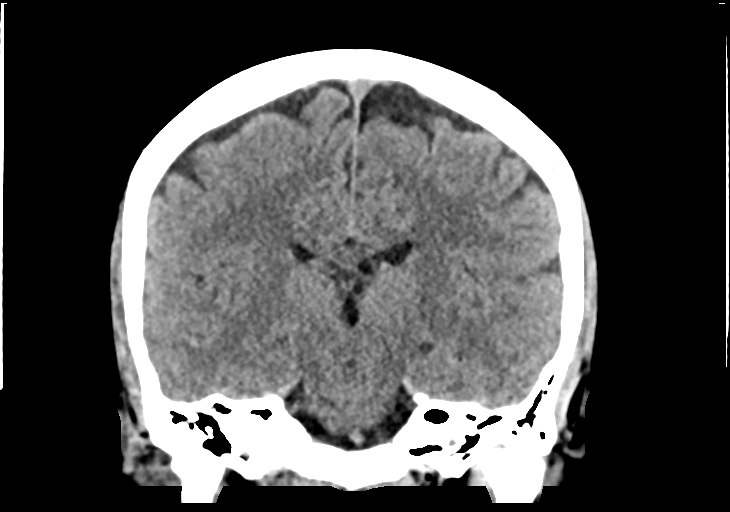

[Series 6: sag soft · sagittal · 0.30mm/px · 3 of 53 slices shown]
[im 18/53  brain]
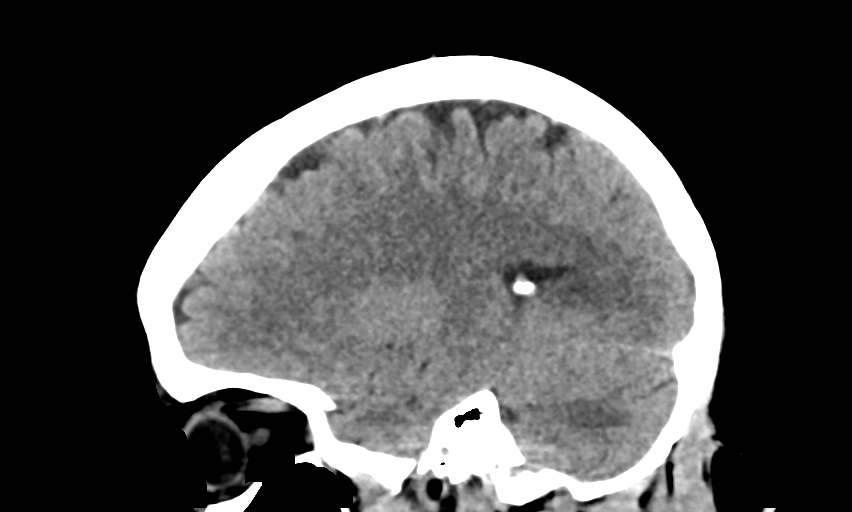
[im 27/53  brain]
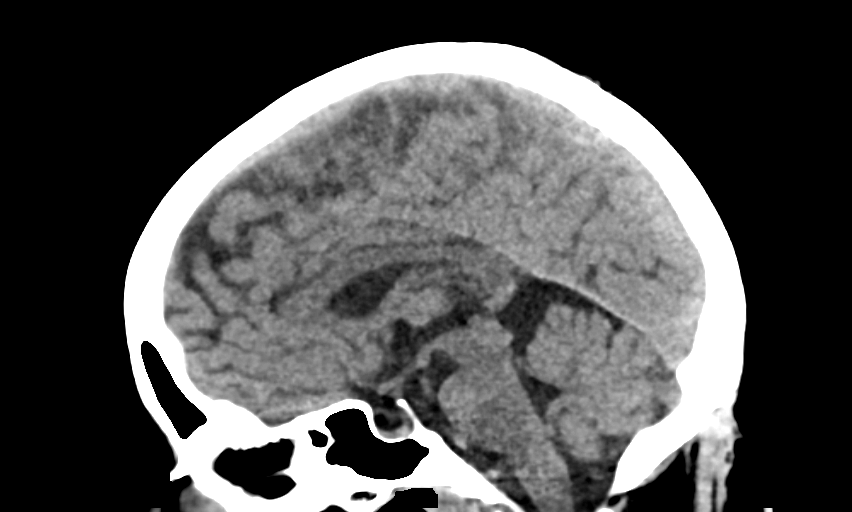
[im 35/53  brain]
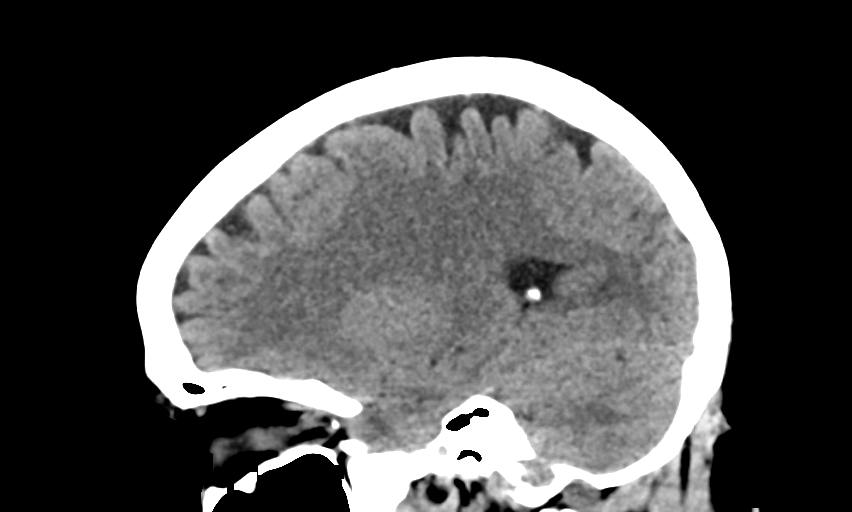

[15 of 47 positions shown; findings below may reference images not displayed]

FINDINGS: Brain: No evidence of acute infarction, hemorrhage, hydrocephalus,
extra-axial collection or mass lesion/mass effect.

Vascular: No hyperdense vessel or unexpected calcification.

Skull: Normal. Negative for fracture or focal lesion.

Sinuses/Orbits: No acute finding.

Other: None.
IMPRESSION: Negative CT of the head.

By: Nils-Emil Tufvesson M.D.

## 2020-01-31 ENCOUNTER — Encounter: Payer: Self-pay | Admitting: Registered Nurse

## 2020-01-31 NOTE — Progress Notes (Signed)
Acute Office Visit  Subjective:    Patient ID: Angela Brooks, female    DOB: February 15, 1970, 50 y.o.   MRN: [id number]  Chief Complaint  Patient presents with  . Vaginal Pain    Patient states she cut her vagina while shaving and now notice a knot. patient states she is trying to make sure everything is ok    HPI Patient is in today for vaginal pain  Cut herself while shaving Now has small knot on vulva/outer labia. No signs of infection, wants peace of mind Seems to be generally improving  Otherwise due for flu shot, mammography, and colon ca screening. Will place orders today.  Past Medical History:  Diagnosis Date  . Allergy   . Anemia   . Anxiety   . Depression   . GERD (gastroesophageal reflux disease)   . Hypertension    Stopped taking meds x 1 year ago    Past Surgical History:  Procedure Laterality Date  . ABDOMINAL HYSTERECTOMY  07/29/2018  . CHOLECYSTECTOMY    . VAGINAL HYSTERECTOMY Right 07/29/2018   Procedure: HYSTERECTOMY VAGINAL Right  SALPINGECTOMY;  Surgeon: Chancy Milroy, MD;  Location: [facility];  Service: Gynecology;  Laterality: Right;  . WISDOM TOOTH EXTRACTION      Family History  Problem Relation Age of Onset  . Hypertension Mother   . Diabetes Maternal Grandmother     Social History   Socioeconomic History  . Marital status: Significant Other    Spouse name: Not on file  . Number of children: Not on file  . Years of education: Not on file  . Highest education level: Not on file  Occupational History  . Not on file  Tobacco Use  . Smoking status: Never Smoker  . Smokeless tobacco: Never Used  Vaping Use  . Vaping Use: Never used  Substance and Sexual Activity  . Alcohol use: No    Alcohol/week: 0.0 standard drinks  . Drug use: No  . Sexual activity: Yes    Birth control/protection: None  Other Topics Concern  . Not on file  Social History Narrative  . Not on file   Social Determinants of Health   Financial Resource Strain:  Not on file  Food Insecurity: Not on file  Transportation Needs: Not on file  Physical Activity: Not on file  Stress: Not on file  Social Connections: Not on file  Intimate Partner Violence: Not on file    Outpatient Medications Prior to Visit  Medication Sig Dispense Refill  . acetaminophen (TYLENOL) 500 MG tablet Take 1,000 mg by mouth every 6 (six) hours as needed.    . hydrochlorothiazide (HYDRODIURIL) 25 MG tablet Take 1 tablet (25 mg total) by mouth daily. 90 tablet 3  . ibuprofen (ADVIL) 800 MG tablet Take 1 tablet (800 mg total) by mouth every 8 (eight) hours as needed. 30 tablet 0  . Multiple Vitamin (MULTIVITAMIN WITH MINERALS) TABS tablet Take 2 tablets by mouth daily.    . Nutritional Supplements (IMMUNE ENHANCE) TABS Take by mouth.    . ALPRAZolam (XANAX) 0.5 MG tablet Take 1 tablet (0.5 mg total) by mouth 3 (three) times daily as needed for anxiety. (Patient not taking: Reported on 11/23/2019) 90 tablet 1  . Ascorbic Acid (VITAMIN C) 1000 MG tablet Take 1,000 mg by mouth daily.    . meloxicam (MOBIC) 7.5 MG tablet Take 1 tablet (7.5 mg total) by mouth daily. (Patient not taking: Reported on 11/23/2019) 30 tablet 0   No  facility-administered medications prior to visit.    No Known Allergies  Review of Systems  Constitutional: Negative.   HENT: Negative.   Eyes: Negative.   Respiratory: Negative.   Cardiovascular: Negative.   Gastrointestinal: Negative.   Genitourinary: Negative.   Musculoskeletal: Negative.   Skin: Negative.   Neurological: Negative.   Psychiatric/Behavioral: Negative.   All other systems reviewed and are negative.      Objective:    Physical Exam Vitals and nursing note reviewed. Exam conducted with a chaperone present.  Constitutional:      General: She is not in acute distress.    Appearance: Normal appearance. She is not ill-appearing, toxic-appearing or diaphoretic.  Cardiovascular:     Rate and Rhythm: Normal rate and regular  rhythm.     Pulses: Normal pulses.     Heart sounds: Normal heart sounds. No murmur heard. No friction rub. No gallop.   Pulmonary:     Effort: Pulmonary effort is normal. No respiratory distress.     Breath sounds: Normal breath sounds. No stridor. No wheezing, rhonchi or rales.  Chest:     Chest wall: No tenderness.  Genitourinary:    Labia:        Right: Lesion (small area of inflammation around small laceration. does not appear infected.) present.   Skin:    General: Skin is warm and dry.     Capillary Refill: Capillary refill takes less than 2 seconds.  Neurological:     General: No focal deficit present.     Mental Status: She is alert and oriented to person, place, and time. Mental status is at baseline.  Psychiatric:        Mood and Affect: Mood normal.        Behavior: Behavior normal.        Thought Content: Thought content normal.        Judgment: Judgment normal.     BP (!) 154/88   Pulse 85   Temp 98.1 F (36.7 C) (Temporal)   Resp 18   Ht 5\' 5"  (1.651 m)   Wt 200 lb 12.8 oz (91.1 kg)   LMP 07/02/2018 (Exact Date)   SpO2 98%   BMI 33.41 kg/m  Wt Readings from Last 3 Encounters:  11/23/19 200 lb 12.8 oz (91.1 kg)  03/31/19 204 lb 12.8 oz (92.9 kg)  10/14/18 206 lb (93.4 kg)    Health Maintenance Due  Topic Date Due  . PAP SMEAR-Modifier  02/06/2017    There are no preventive care reminders to display for this patient.   Lab Results  Component Value Date   TSH 1.520 04/17/2017   Lab Results  Component Value Date   WBC 15.1 (H) 07/30/2018   HGB 7.4 (L) 07/30/2018   HCT 26.5 (L) 07/30/2018   MCV 65.1 (L) 07/30/2018   PLT 196 07/30/2018   Lab Results  Component Value Date   NA 133 (L) 07/30/2018   K 4.2 07/30/2018   CO2 26 07/30/2018   GLUCOSE 104 (H) 07/30/2018   BUN 11 07/30/2018   CREATININE 0.86 07/30/2018   BILITOT 1.0 12/29/2017   ALKPHOS 53 12/29/2017   AST 19 12/29/2017   ALT 19 12/29/2017   PROT 8.0 12/29/2017   ALBUMIN 4.0  12/29/2017   CALCIUM 9.2 07/30/2018   ANIONGAP 8 07/30/2018   Lab Results  Component Value Date   CHOL 141 02/06/2014   Lab Results  Component Value Date   HDL 48 02/06/2014   Lab Results  Component Value Date   LDLCALC 83 02/06/2014   Lab Results  Component Value Date   TRIG 49 02/06/2014   Lab Results  Component Value Date   CHOLHDL 2.9 02/06/2014   Lab Results  Component Value Date   HGBA1C 5.5 04/17/2017       Assessment & Plan:   Problem List Items Addressed This Visit   None   Visit Diagnoses    Colon cancer screening    -  Primary   Relevant Orders   Cologuard (Completed)   Screening mammogram for breast cancer       Relevant Orders   MM Digital Diagnostic Bilat   MM Digital Screening (Completed)   Flu vaccine need       Relevant Orders   Flu Vaccine QUAD 6+ mos PF IM (Fluarix Quad PF) (Completed)       No orders of the defined types were placed in this encounter.  PLAN  Appears as inflammation. Keep clean and dry, will likely resolve spontaneously  Flu vaccine given  Orders for mammography and cologuard placed  Patient encouraged to call clinic with any questions, comments, or concerns.   Maximiano Coss, NP

## 2020-05-09 ENCOUNTER — Other Ambulatory Visit: Payer: Self-pay | Admitting: Registered Nurse

## 2020-05-09 DIAGNOSIS — I10 Essential (primary) hypertension: Secondary | ICD-10-CM

## 2020-06-09 ENCOUNTER — Telehealth: Payer: Self-pay

## 2020-06-09 NOTE — Telephone Encounter (Signed)
VOB has been submitted for Monovisc, bilateral knee. Pending BV

## 2020-06-12 ENCOUNTER — Telehealth: Payer: Self-pay

## 2020-06-12 NOTE — Telephone Encounter (Signed)
PA required for Monoivs, bilateral knee. Faxed completed PA form to [place] at [phone]. Pending PA

## 2020-06-13 ENCOUNTER — Telehealth: Payer: Self-pay

## 2020-06-13 NOTE — Telephone Encounter (Signed)
Approved for Monovisc, bilateral knee. [facility] Once OOP is met, patient will be covered at 100%. Co-pay of $50.00 PA Approval# [id number] Valid 06/12/2020- 07/10/2020  Appt.06/20/2020 with Dr. Erlinda Hong

## 2020-06-20 ENCOUNTER — Ambulatory Visit: Payer: Managed Care, Other (non HMO) | Admitting: Orthopaedic Surgery

## 2020-06-20 DIAGNOSIS — M170 Bilateral primary osteoarthritis of knee: Secondary | ICD-10-CM | POA: Diagnosis not present

## 2020-06-20 MED ORDER — HYALURONAN 88 MG/4ML IX SOSY
88.0000 mg | PREFILLED_SYRINGE | INTRA_ARTICULAR | Status: AC | PRN
  Administered 2020-06-20: 88 mg via INTRA_ARTICULAR

## 2020-06-20 NOTE — Progress Notes (Signed)
   Office Visit Note   Patient: Angela Brooks           Date of Birth: [birth year]           MRN: [id number] Visit Date: 06/20/2020              Requested by: Maximiano Coss, NP [address] [place],  [place] [address] PCP: Maximiano Coss, NP   Assessment & Plan: Visit Diagnoses:  1. Bilateral primary osteoarthritis of knee     Plan: Impression is bilateral knee degenerative joint disease.  Today, we proceeded with bilateral knee Monovisc injections.  She tolerated these well.  She will follow-up with [place] as needed.  Follow-Up Instructions: Return if symptoms worsen or fail to improve.   Orders:  No orders of the defined types were placed in this encounter.  No orders of the defined types were placed in this encounter.     Procedures: Large Joint Inj: bilateral knee on 06/20/2020 5:02 PM Indications: pain Details: 22 G needle  Arthrogram: No  Medications (Right): 88 mg Hyaluronan 88 MG/4ML Medications (Left): 88 mg Hyaluronan 88 MG/4ML Outcome: tolerated well, no immediate complications Patient was prepped and draped in the usual sterile fashion.      Clinical Data: No additional findings.   Subjective: Chief Complaint  Patient presents with   Right Knee - Pain   Left Knee - Pain    HPI patient is a pleasant 50 year old female who comes in today with bilateral knee DJD.  She is here for repeat Monovisc injections to both knees.  She has had great relief from injections in the past.     Objective: Vital Signs: LMP 07/02/2018 (Exact Date)     Ortho Exam stable bilateral knee exam without effusion  Specialty Comments:  No specialty comments available.  Imaging: No new imaging   PMFS History: Patient Active Problem List   Diagnosis Date Noted   Post-operative state 07/29/2018   Patella alta 04/17/2017   Iron deficiency anemia due to chronic blood loss 04/17/2017   Grief 09/28/2014   Essential hypertension 09/28/2014   Dental caries  extending into pulp 02/06/2014   GERD (gastroesophageal reflux disease) 05/14/2011   Past Medical History:  Diagnosis Date   Allergy    Anemia    Anxiety    Depression    GERD (gastroesophageal reflux disease)    Hypertension    Stopped taking meds x 1 year ago    Family History  Problem Relation Age of Onset   Hypertension Mother    Diabetes Maternal Grandmother     Past Surgical History:  Procedure Laterality Date   ABDOMINAL HYSTERECTOMY  07/29/2018   CHOLECYSTECTOMY     VAGINAL HYSTERECTOMY Right 07/29/2018   Procedure: HYSTERECTOMY VAGINAL Right  SALPINGECTOMY;  Surgeon: Chancy Milroy, MD;  Location: [facility];  Service: Gynecology;  Laterality: Right;   WISDOM TOOTH EXTRACTION     Social History   Occupational History   Not on file  Tobacco Use   Smoking status: Never   Smokeless tobacco: Never  Vaping Use   Vaping Use: Never used  Substance and Sexual Activity   Alcohol use: No    Alcohol/week: 0.0 standard drinks   Drug use: No   Sexual activity: Yes    Birth control/protection: None

## 2020-07-20 ENCOUNTER — Other Ambulatory Visit: Payer: Self-pay | Admitting: Registered Nurse

## 2020-07-20 DIAGNOSIS — I10 Essential (primary) hypertension: Secondary | ICD-10-CM

## 2020-08-24 ENCOUNTER — Other Ambulatory Visit: Payer: Self-pay

## 2020-08-24 ENCOUNTER — Encounter: Payer: Self-pay | Admitting: Registered Nurse

## 2020-08-24 DIAGNOSIS — I10 Essential (primary) hypertension: Secondary | ICD-10-CM

## 2020-08-24 MED ORDER — HYDROCHLOROTHIAZIDE 25 MG PO TABS
25.0000 mg | ORAL_TABLET | Freq: Every day | ORAL | 0 refills | Status: DC
Start: 2020-08-24 — End: 2020-08-30

## 2020-08-30 ENCOUNTER — Other Ambulatory Visit: Payer: Self-pay

## 2020-08-30 ENCOUNTER — Encounter: Payer: Self-pay | Admitting: Registered Nurse

## 2020-08-30 ENCOUNTER — Ambulatory Visit: Payer: Managed Care, Other (non HMO) | Admitting: Registered Nurse

## 2020-08-30 VITALS — BP 124/82 | HR 75 | Temp 98.2°F | Resp 17 | Ht 65.0 in | Wt 194.8 lb

## 2020-08-30 DIAGNOSIS — Z130 Encounter for screening for diseases of the blood and blood-forming organs and certain disorders involving the immune mechanism: Secondary | ICD-10-CM | POA: Diagnosis not present

## 2020-08-30 DIAGNOSIS — Z1329 Encounter for screening for other suspected endocrine disorder: Secondary | ICD-10-CM | POA: Diagnosis not present

## 2020-08-30 DIAGNOSIS — Z13220 Encounter for screening for lipoid disorders: Secondary | ICD-10-CM | POA: Diagnosis not present

## 2020-08-30 DIAGNOSIS — M25561 Pain in right knee: Secondary | ICD-10-CM | POA: Diagnosis not present

## 2020-08-30 DIAGNOSIS — Z13228 Encounter for screening for other metabolic disorders: Secondary | ICD-10-CM

## 2020-08-30 DIAGNOSIS — I10 Essential (primary) hypertension: Secondary | ICD-10-CM

## 2020-08-30 LAB — TSH: TSH: 1.55 u[IU]/mL (ref 0.35–5.50)

## 2020-08-30 LAB — COMPREHENSIVE METABOLIC PANEL
ALT: 19 U/L (ref 0–35)
AST: 16 U/L (ref 0–37)
Albumin: 4.2 g/dL (ref 3.5–5.2)
Alkaline Phosphatase: 60 U/L (ref 39–117)
BUN: 19 mg/dL (ref 6–23)
CO2: 26 mEq/L (ref 19–32)
Calcium: 9.7 mg/dL (ref 8.4–10.5)
Chloride: 99 mEq/L (ref 96–112)
Creatinine, Ser: 0.93 mg/dL (ref 0.40–1.20)
GFR: 71.31 mL/min (ref >60.00)
Glucose, Bld: 78 mg/dL (ref 70–99)
Potassium: 3.3 mEq/L — ABNORMAL LOW (ref 3.5–5.1)
Sodium: 135 mEq/L (ref 135–145)
Total Bilirubin: 0.9 mg/dL (ref 0.2–1.2)
Total Protein: 7.4 g/dL (ref 6.0–8.3)

## 2020-08-30 LAB — LIPID PANEL
Cholesterol: 182 mg/dL (ref 0–200)
HDL: 49.6 mg/dL (ref >39.00)
LDL Cholesterol: 114 mg/dL — ABNORMAL HIGH (ref 0–99)
NonHDL: 132.51
Total CHOL/HDL Ratio: 4
Triglycerides: 92 mg/dL (ref 0.0–149.0)
VLDL: 18.4 mg/dL (ref 0.0–40.0)

## 2020-08-30 LAB — CBC WITH DIFFERENTIAL/PLATELET
Basophils Absolute: 0.1 10*3/uL (ref 0.0–0.1)
Basophils Relative: 1 % (ref 0.0–3.0)
Eosinophils Absolute: 0.2 10*3/uL (ref 0.0–0.7)
Eosinophils Relative: 2.4 % (ref 0.0–5.0)
HCT: 43.7 % (ref 36.0–46.0)
Hemoglobin: 14.4 g/dL (ref 12.0–15.0)
Lymphocytes Relative: 26.1 % (ref 12.0–46.0)
Lymphs Abs: 1.8 10*3/uL (ref 0.7–4.0)
MCHC: 32.8 g/dL (ref 30.0–36.0)
MCV: 84.9 fl (ref 78.0–100.0)
Monocytes Absolute: 0.4 10*3/uL (ref 0.1–1.0)
Monocytes Relative: 6.4 % (ref 3.0–12.0)
Neutro Abs: 4.5 10*3/uL (ref 1.4–7.7)
Neutrophils Relative %: 64.1 % (ref 43.0–77.0)
Platelets: 232 10*3/uL (ref 150.0–400.0)
RBC: 5.15 Mil/uL — ABNORMAL HIGH (ref 3.87–5.11)
RDW: 13.7 % (ref 11.5–15.5)
WBC: 7 10*3/uL (ref 4.0–10.5)

## 2020-08-30 LAB — HEMOGLOBIN A1C: Hgb A1c MFr Bld: 5.8 % (ref 4.6–6.5)

## 2020-08-30 MED ORDER — IBUPROFEN 800 MG PO TABS
800.0000 mg | ORAL_TABLET | Freq: Three times a day (TID) | ORAL | 0 refills | Status: AC | PRN
Start: 2020-08-30 — End: ?

## 2020-08-30 MED ORDER — HYDROCHLOROTHIAZIDE 25 MG PO TABS: 25.0000 mg | ORAL_TABLET | Freq: Every day | ORAL | 3 refills | Status: AC

## 2020-08-30 NOTE — Patient Instructions (Signed)
Ms. Angela Brooks -  Always a pleasure to see you  Med refills sent, bp looking great today  Labs today will be back this afternoon. I'll call with any acute concerns  See you in a year, sooner if labs indicate or if you need anything in the mean time   Thank you  Rich

## 2020-08-30 NOTE — Progress Notes (Addendum)
Established Patient Office Visit  Subjective:  Patient ID: Angela Brooks, female    DOB: 02-13-1970  Age: 50 y.o. MRN: [id number]  CC:  Chief Complaint  Patient presents with   Hypertension    Pt states her BP goes up and down but she feels that has to do with emotional triggers   Gastroesophageal Reflux    Patient would like to discuss medication    HPI Angela Brooks presents for HTN, GERD  Hypertension: Patient Currently taking: hctz '25mg'$  po qd Good effect. No AEs. Denies CV symptoms including: chest pain, shob, doe, headache, visual changes, fatigue, claudication, and dependent edema.   Previous readings and labs: BP Readings from Last 3 Encounters:  08/30/20 124/82  11/23/19 (!) 154/88  04/13/19 120/84   Lab Results  Component Value Date   CREATININE 0.93 08/30/2020    GERD Ongoing Usually silent No nvd, melena Admits to poor diet in past couple of weeks - anniversary of her mother's passing Has been on pantoprazole in the past with good effect.   Past Medical History:  Diagnosis Date   Allergy    Anemia    Anxiety    Depression    GERD (gastroesophageal reflux disease)    Hypertension    Stopped taking meds x 1 year ago    Past Surgical History:  Procedure Laterality Date   ABDOMINAL HYSTERECTOMY  07/29/2018   CHOLECYSTECTOMY     VAGINAL HYSTERECTOMY Right 07/29/2018   Procedure: HYSTERECTOMY VAGINAL Right  SALPINGECTOMY;  Surgeon: Chancy Milroy, MD;  Location: [facility];  Service: Gynecology;  Laterality: Right;   WISDOM TOOTH EXTRACTION      Family History  Problem Relation Age of Onset   Hypertension Mother    Diabetes Maternal Grandmother     Social History   Socioeconomic History   Marital status: Significant Other    Spouse name: Not on file   Number of children: Not on file   Years of education: Not on file   Highest education level: Not on file  Occupational History   Not on file  Tobacco Use   Smoking status: Never    Smokeless tobacco: Never  Vaping Use   Vaping Use: Never used  Substance and Sexual Activity   Alcohol use: No    Alcohol/week: 0.0 standard drinks   Drug use: No   Sexual activity: Yes    Birth control/protection: None  Other Topics Concern   Not on file  Social History Narrative   Not on file   Social Determinants of Health   Financial Resource Strain: Not on file  Food Insecurity: Not on file  Transportation Needs: Not on file  Physical Activity: Not on file  Stress: Not on file  Social Connections: Not on file  Intimate Partner Violence: Not on file    Outpatient Medications Prior to Visit  Medication Sig Dispense Refill   acetaminophen (TYLENOL) 500 MG tablet Take 1,000 mg by mouth every 6 (six) hours as needed.     Ascorbic Acid (VITAMIN C) 1000 MG tablet Take 1,000 mg by mouth daily.     Multiple Vitamin (MULTIVITAMIN WITH MINERALS) TABS tablet Take 2 tablets by mouth daily.     Nutritional Supplements (IMMUNE ENHANCE) TABS Take by mouth.     hydrochlorothiazide (HYDRODIURIL) 25 MG tablet Take 1 tablet (25 mg total) by mouth daily. 30 tablet 0   ibuprofen (ADVIL) 800 MG tablet Take 1 tablet (800 mg total) by mouth every 8 (eight) hours as  needed. 30 tablet 0   ALPRAZolam (XANAX) 0.5 MG tablet Take 1 tablet (0.5 mg total) by mouth 3 (three) times daily as needed for anxiety. (Patient not taking: No sig reported) 90 tablet 1   meloxicam (MOBIC) 7.5 MG tablet Take 1 tablet (7.5 mg total) by mouth daily. (Patient not taking: No sig reported) 30 tablet 0   No facility-administered medications prior to visit.    No Known Allergies  ROS Review of Systems  Constitutional: Negative.   HENT: Negative.    Eyes: Negative.   Respiratory: Negative.    Cardiovascular: Negative.   Gastrointestinal:  Positive for abdominal pain (gerd). Negative for abdominal distention, diarrhea, nausea and vomiting.  Genitourinary: Negative.   Musculoskeletal: Negative.   Skin: Negative.    Neurological: Negative.   Psychiatric/Behavioral: Negative.    All other systems reviewed and are negative.    Objective:    Physical Exam Vitals and nursing note reviewed.  Constitutional:      General: She is not in acute distress.    Appearance: Normal appearance. She is normal weight. She is not ill-appearing, toxic-appearing or diaphoretic.  Cardiovascular:     Rate and Rhythm: Normal rate and regular rhythm.     Heart sounds: Normal heart sounds. No murmur heard.   No friction rub. No gallop.  Pulmonary:     Effort: Pulmonary effort is normal. No respiratory distress.     Breath sounds: Normal breath sounds. No stridor. No wheezing, rhonchi or rales.  Chest:     Chest wall: No tenderness.  Skin:    General: Skin is warm and dry.  Neurological:     General: No focal deficit present.     Mental Status: She is alert and oriented to person, place, and time. Mental status is at baseline.  Psychiatric:        Mood and Affect: Mood normal.        Behavior: Behavior normal.        Thought Content: Thought content normal.        Judgment: Judgment normal.    BP 124/82   Pulse 75   Temp 98.2 F (36.8 C) (Temporal)   Resp 17   Ht '5\' 5"'$  (1.651 m)   Wt 194 lb 12.8 oz (88.4 kg)   LMP 07/02/2018 (Exact Date)   SpO2 98%   BMI 32.42 kg/m  Wt Readings from Last 3 Encounters:  08/30/20 194 lb 12.8 oz (88.4 kg)  11/23/19 200 lb 12.8 oz (91.1 kg)  03/31/19 204 lb 12.8 oz (92.9 kg)     Health Maintenance Due  Topic Date Due   PAP SMEAR-Modifier  02/06/2017   INFLUENZA VACCINE  08/07/2020    There are no preventive care reminders to display for this patient.  Lab Results  Component Value Date   TSH 1.55 08/30/2020   Lab Results  Component Value Date   WBC 7.0 08/30/2020   HGB 14.4 08/30/2020   HCT 43.7 08/30/2020   MCV 84.9 08/30/2020   PLT 232.0 08/30/2020   Lab Results  Component Value Date   NA 135 08/30/2020   K 3.3 (L) 08/30/2020   CO2 26 08/30/2020    GLUCOSE 78 08/30/2020   BUN 19 08/30/2020   CREATININE 0.93 08/30/2020   BILITOT 0.9 08/30/2020   ALKPHOS 60 08/30/2020   AST 16 08/30/2020   ALT 19 08/30/2020   PROT 7.4 08/30/2020   ALBUMIN 4.2 08/30/2020   CALCIUM 9.7 08/30/2020   ANIONGAP 8 07/30/2018  GFR 71.31 08/30/2020   Lab Results  Component Value Date   CHOL 182 08/30/2020   Lab Results  Component Value Date   HDL 49.60 08/30/2020   Lab Results  Component Value Date   LDLCALC 114 (H) 08/30/2020   Lab Results  Component Value Date   TRIG 92.0 08/30/2020   Lab Results  Component Value Date   CHOLHDL 4 08/30/2020   Lab Results  Component Value Date   HGBA1C 5.8 08/30/2020      Assessment & Plan:   Problem List Items Addressed This Visit       Cardiovascular and Mediastinum   Essential hypertension   Relevant Medications   hydrochlorothiazide (HYDRODIURIL) 25 MG tablet   Other Visit Diagnoses     Screening for endocrine, metabolic and immunity disorder    -  Primary   Relevant Orders   CBC with Differential/Platelet (Completed)   Comprehensive metabolic panel (Completed)   Hemoglobin A1c (Completed)   TSH (Completed)   Lipid screening       Relevant Orders   Lipid panel (Completed)   Recurrent pain of right knee       Relevant Medications   ibuprofen (ADVIL) 800 MG tablet       Meds ordered this encounter  Medications   ibuprofen (ADVIL) 800 MG tablet    Sig: Take 1 tablet (800 mg total) by mouth every 8 (eight) hours as needed.    Dispense:  30 tablet    Refill:  0    Order Specific Question:   Supervising Provider    Answer:   Carlota Raspberry, JEFFREY R [[id number]]   hydrochlorothiazide (HYDRODIURIL) 25 MG tablet    Sig: Take 1 tablet (25 mg total) by mouth daily.    Dispense:  90 tablet    Refill:  3    Order Specific Question:   Supervising Provider    Answer:   Carlota Raspberry, JEFFREY R [id number]    Follow-up: Return in about 1 year (around 08/30/2021) for CPE and labs.   PLAN Labs collected.  Will follow up with the patient as warranted. Remain on current regimen Discussed lifestyle changes for GERD Patient encouraged to call clinic with any questions, comments, or concerns.  Maximiano Coss, NP

## 2020-10-06 IMAGING — US US PELVIS COMPLETE TRANSABD/TRANSVAG
1 series · 15 of 25 positions shown · non-contrast
Comparison: None

CLINICAL DATA: Menorrhagia with regular cycle. Uterine fibroids.
LMP 01/11/2018.

EXAM:
TRANSABDOMINAL AND TRANSVAGINAL ULTRASOUND OF PELVIS
TECHNIQUE: Both transabdominal and transvaginal ultrasound examinations of the
pelvis were performed. Transabdominal technique was performed for
global imaging of the pelvis including uterus, ovaries, adnexal
regions, and pelvic cul-de-sac. It was necessary to proceed with
endovaginal exam following the transabdominal exam to visualize the
endometrium and ovaries.

[Series 1: us pelvis complete transabd/transvag · 65 acquisitions, 15 frames shown]
[im 1/65]
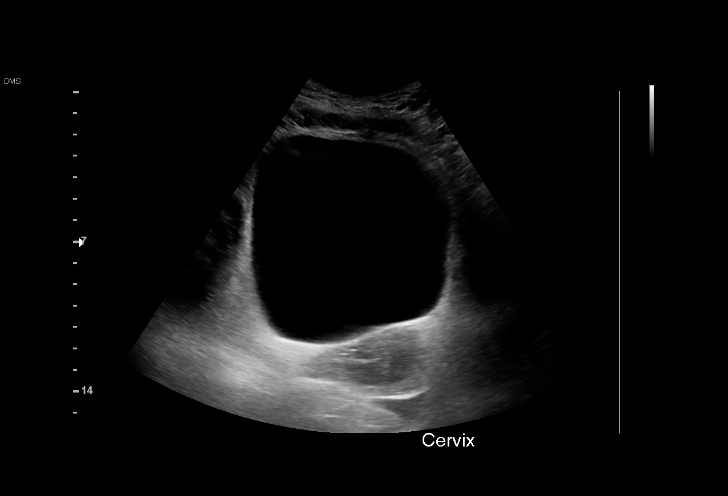
[im 6/65]
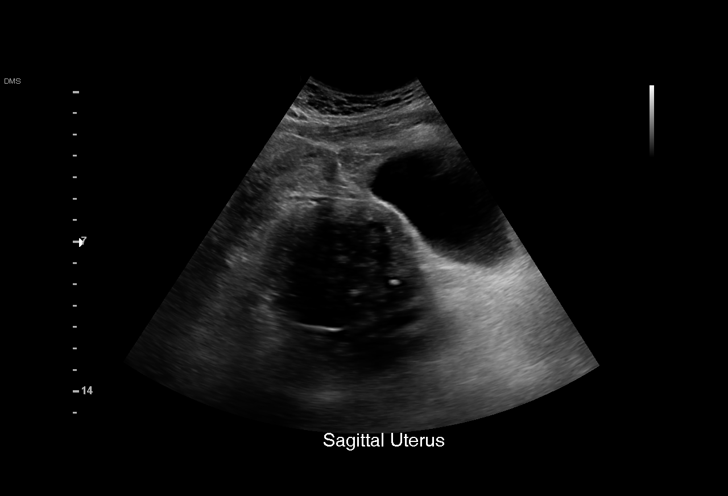
[im 11/65]
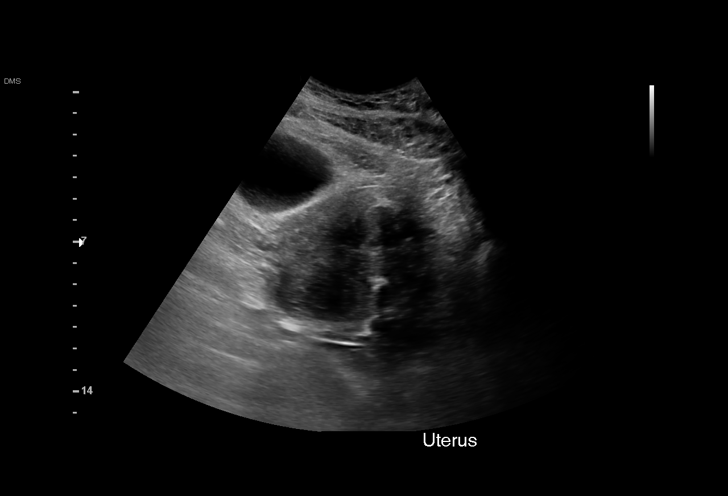
[im 14/65]
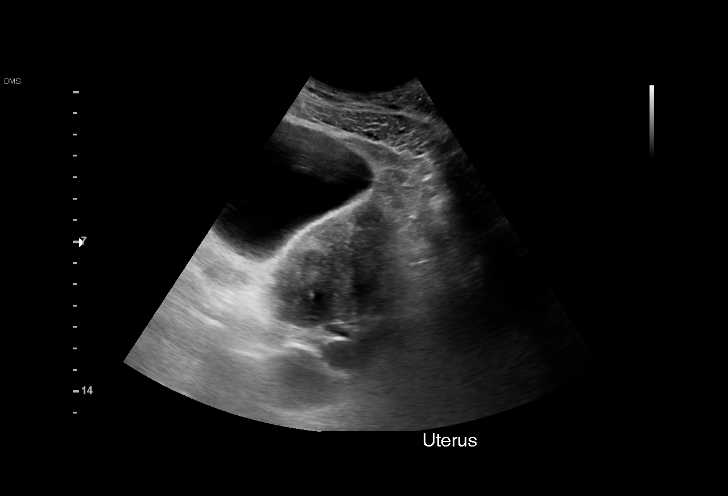
[im 19/65]
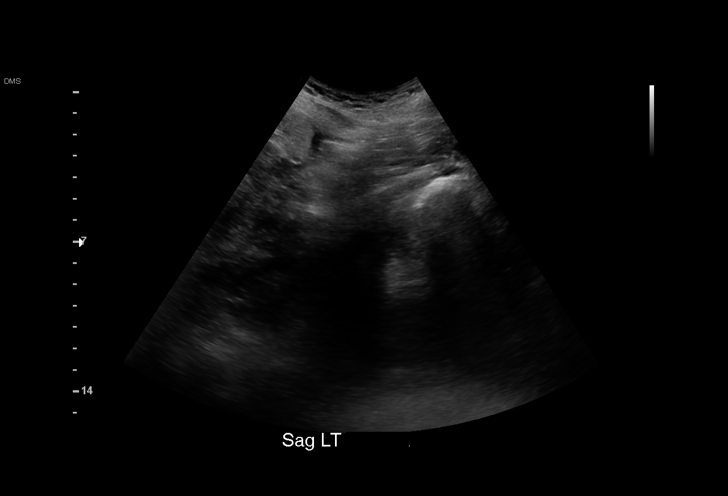
[im 25/65]
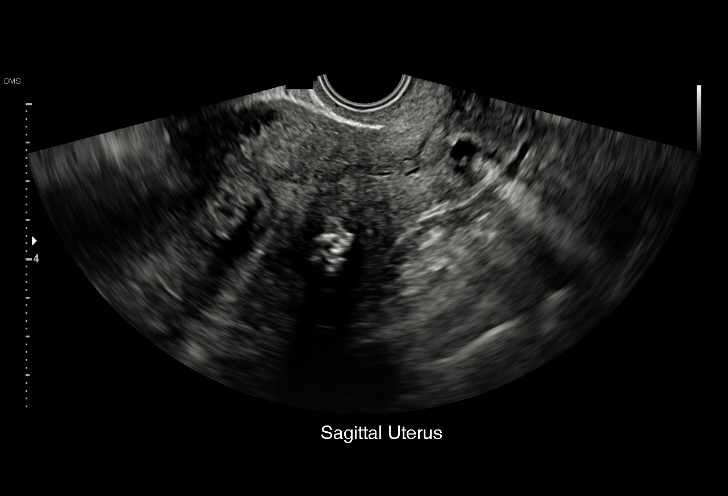
[im 27/65]
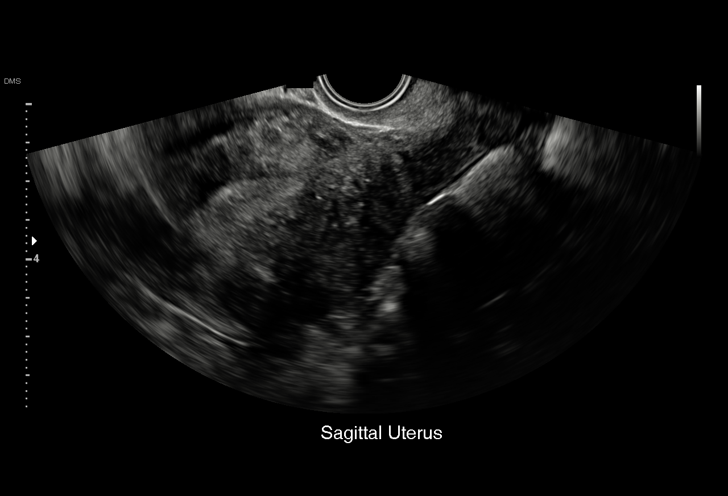
[im 33/65]
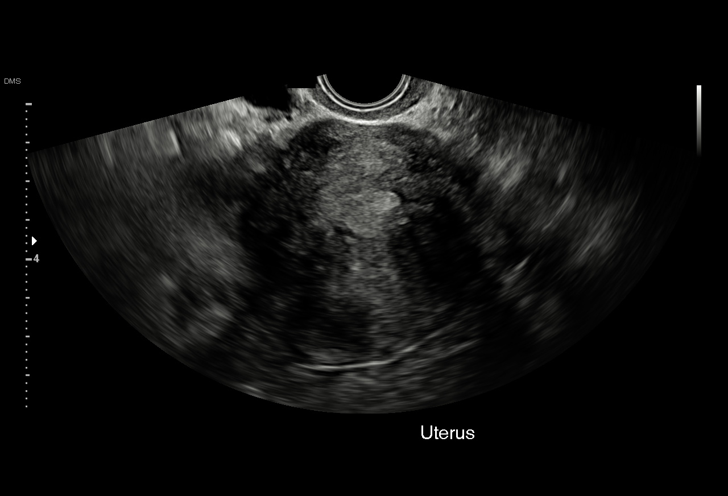
[im 38/65]
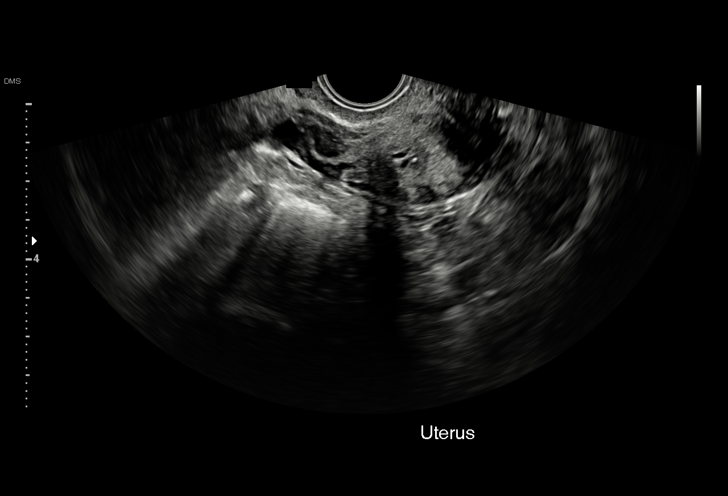
[im 41/65]
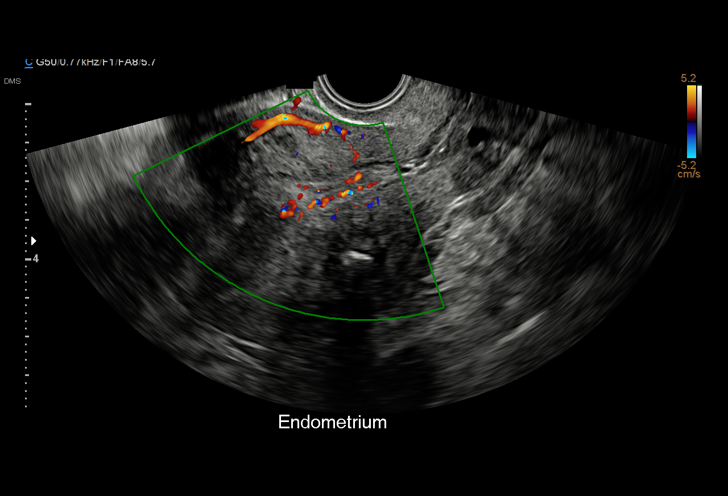
[im 46/65]
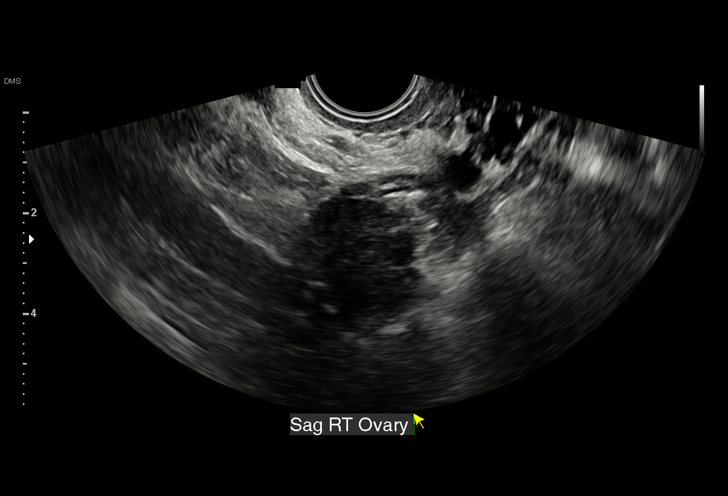
[im 51/65]
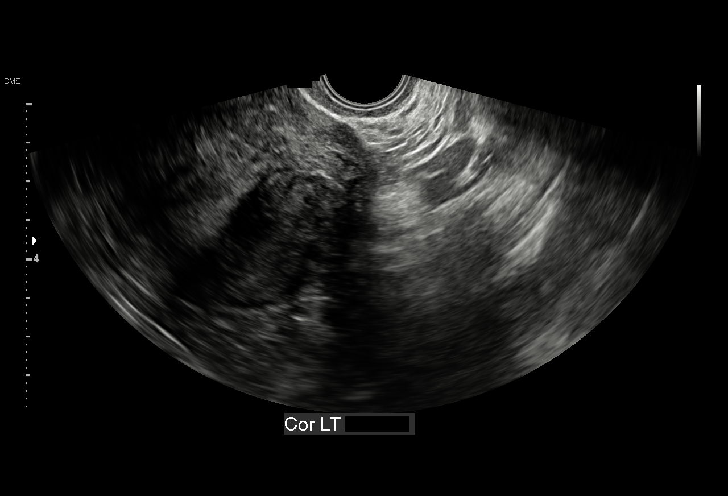
[im 54/65]
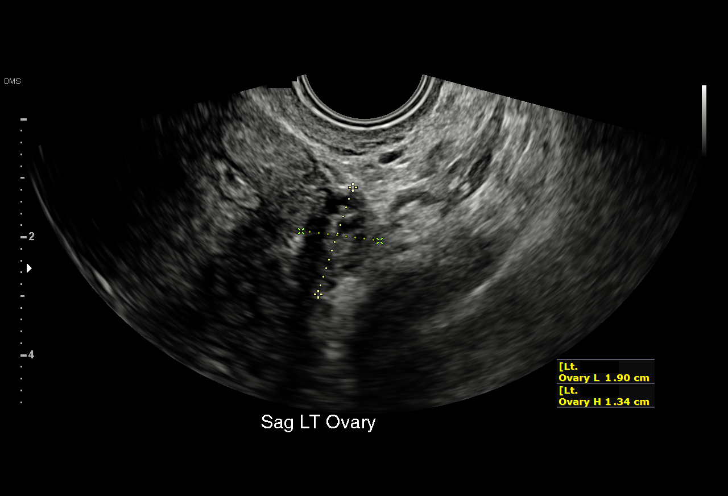
[im 59/65]
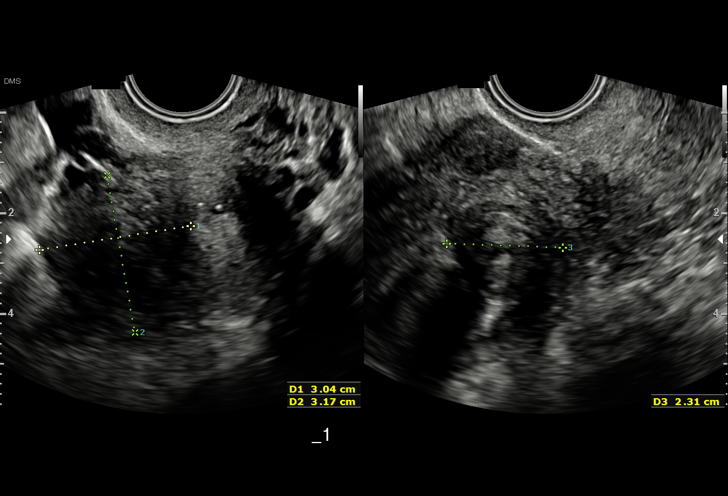
[im 65/65]
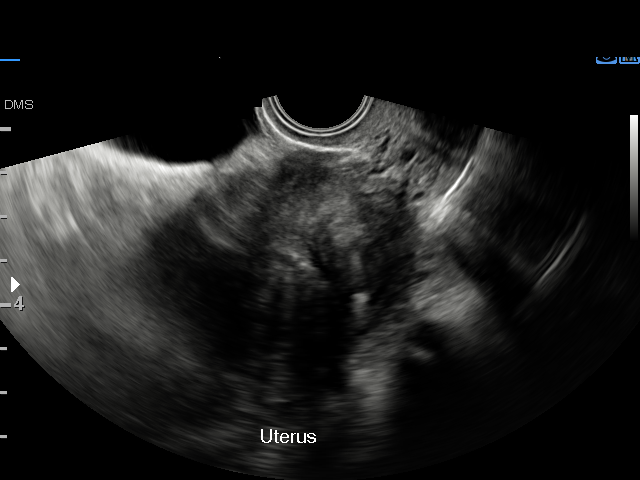

[15 of 25 positions shown; findings below may reference images not displayed]

FINDINGS: Uterus

Measurements: 10.7 x 6.4 x 7.8 cm = volume: 281 mL. Multiple small
uterine fibroids are seen throughout the uterus, some which are
calcified. These range in size from 2-3 cm in diameter. The majority
these are intramural in location, with one 3 cm fibroid in the left
fundus being submucosal in location.

Endometrium

Thickness: 15 mm.  No focal abnormality visualized.

Right ovary

Measurements: 2.7 x 2.2 x 1.6 cm = volume: 5.0 mL. Normal
appearance/no adnexal mass.

Left ovary

Measurements: 1.9 x 1.3 x 1.4 cm = volume: 1.9 mL. Normal
appearance/no adnexal mass.

Other findings

No abnormal free fluid.
IMPRESSION: Multiple small uterine fibroids measuring up to 3 cm.

Normal appearance of both ovaries.

## 2020-10-24 ENCOUNTER — Encounter: Payer: Self-pay | Admitting: *Deleted

## 2021-01-15 ENCOUNTER — Other Ambulatory Visit: Payer: Self-pay | Admitting: Registered Nurse

## 2021-01-15 DIAGNOSIS — Z1231 Encounter for screening mammogram for malignant neoplasm of breast: Secondary | ICD-10-CM

## 2021-01-16 ENCOUNTER — Ambulatory Visit
Admission: RE | Admit: 2021-01-16 | Discharge: 2021-01-16 | Disposition: A | Discharge status: Home / Self Care | Payer: Managed Care, Other (non HMO) | Source: Ambulatory Visit | Attending: Registered Nurse | Admitting: Registered Nurse

## 2021-01-16 DIAGNOSIS — Z1231 Encounter for screening mammogram for malignant neoplasm of breast: Secondary | ICD-10-CM

## 2021-10-02 ENCOUNTER — Encounter: Payer: Managed Care, Other (non HMO) | Admitting: Registered Nurse

## 2021-12-03 ENCOUNTER — Other Ambulatory Visit: Payer: Self-pay | Admitting: Family Medicine

## 2021-12-03 DIAGNOSIS — Z1231 Encounter for screening mammogram for malignant neoplasm of breast: Secondary | ICD-10-CM

## 2022-01-25 ENCOUNTER — Ambulatory Visit
Admission: RE | Admit: 2022-01-25 | Discharge: 2022-01-25 | Disposition: A | Discharge status: Home / Self Care | Payer: Managed Care, Other (non HMO) | Source: Ambulatory Visit | Attending: Family Medicine | Admitting: Family Medicine

## 2022-01-25 DIAGNOSIS — Z1231 Encounter for screening mammogram for malignant neoplasm of breast: Secondary | ICD-10-CM

## 2023-05-13 ENCOUNTER — Other Ambulatory Visit: Payer: Self-pay | Admitting: Family

## 2023-05-13 DIAGNOSIS — Z1231 Encounter for screening mammogram for malignant neoplasm of breast: Secondary | ICD-10-CM

## 2023-05-14 ENCOUNTER — Ambulatory Visit
Admission: RE | Admit: 2023-05-14 | Discharge: 2023-05-14 | Disposition: A | Discharge status: Home / Self Care | Source: Ambulatory Visit | Attending: Family | Admitting: Family

## 2023-05-14 DIAGNOSIS — Z1231 Encounter for screening mammogram for malignant neoplasm of breast: Secondary | ICD-10-CM

## 2023-10-11 IMAGING — MG MM DIGITAL SCREENING BILAT W/ TOMO AND CAD
8 series · 8 of 24 positions shown · non-contrast
Comparison: Previous exam(s).

CLINICAL DATA: Screening.

EXAM:
DIGITAL SCREENING BILATERAL MAMMOGRAM WITH TOMOSYNTHESIS AND CAD
TECHNIQUE: Bilateral screening digital craniocaudal and mediolateral oblique
mammograms were obtained. Bilateral screening digital breast
tomosynthesis was performed. The images were evaluated with
computer-aided detection.

[R MLO synth-2D]
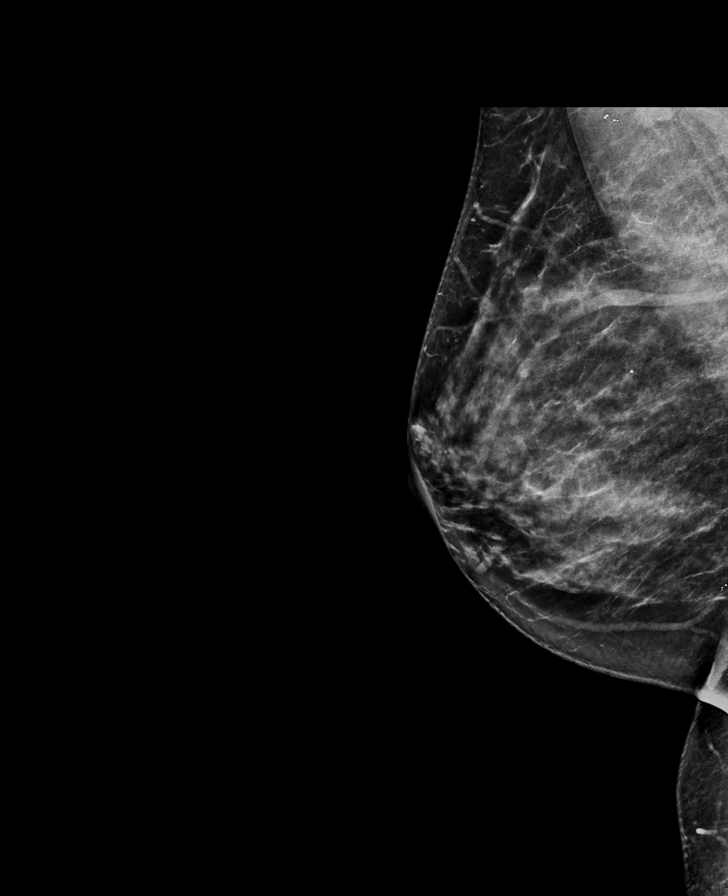

[R CC synth-2D]
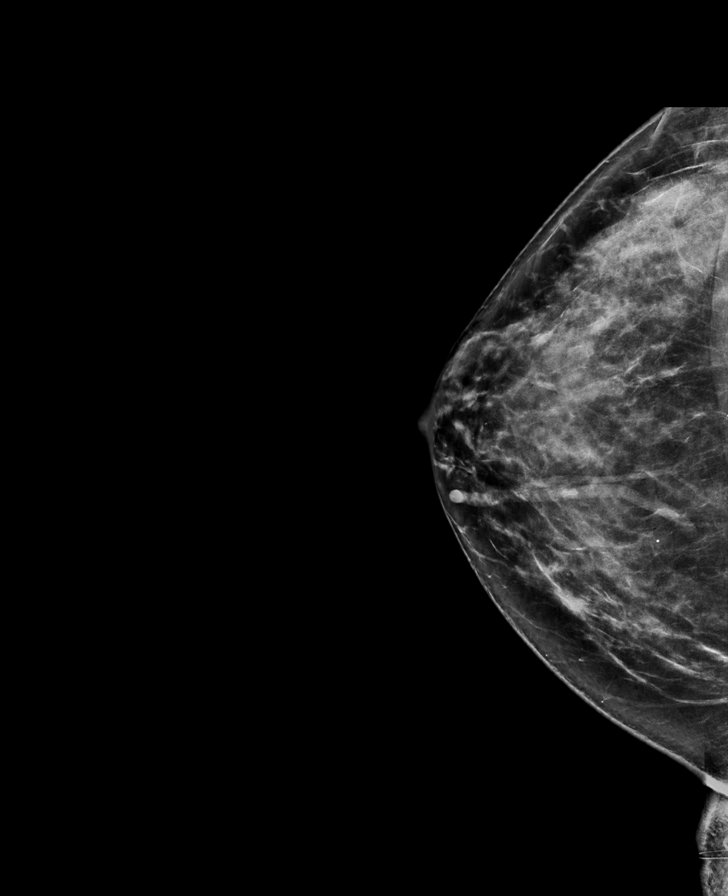

[L MLO synth-2D]
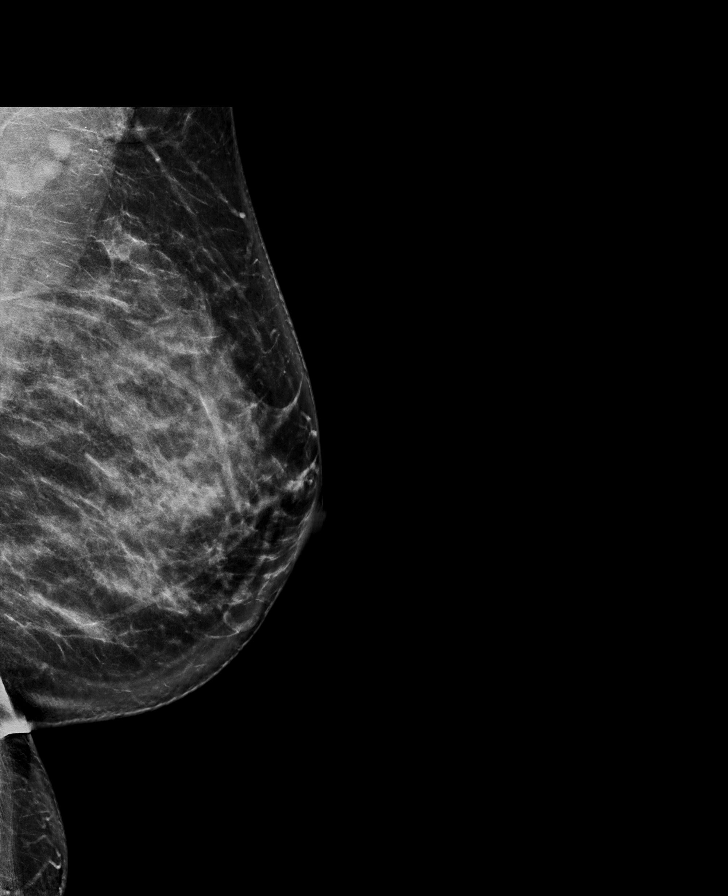

[L CC synth-2D]
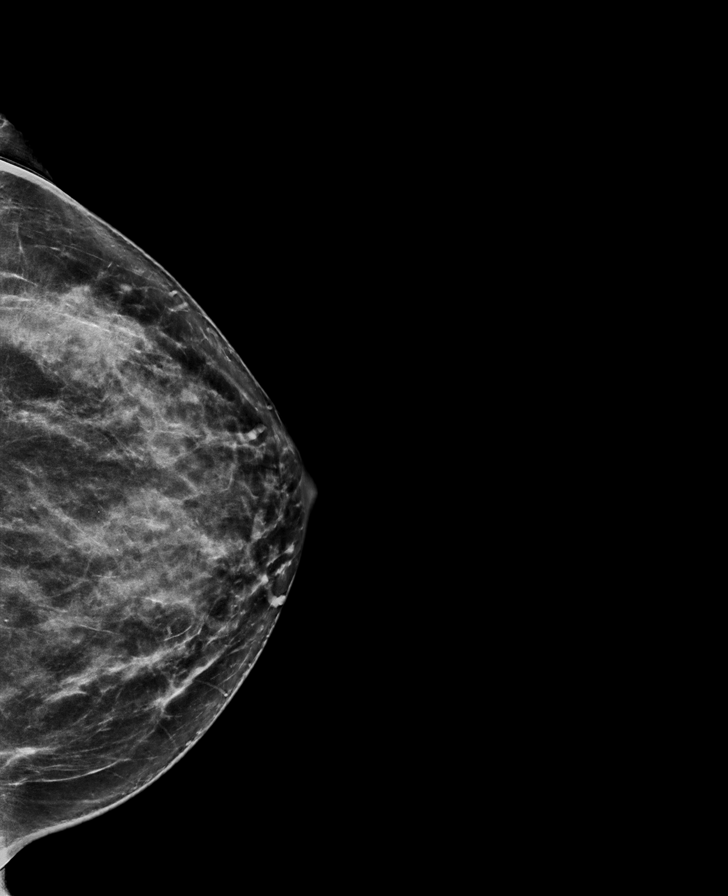

[L MLO tomo · tomo slice 43/84.0]
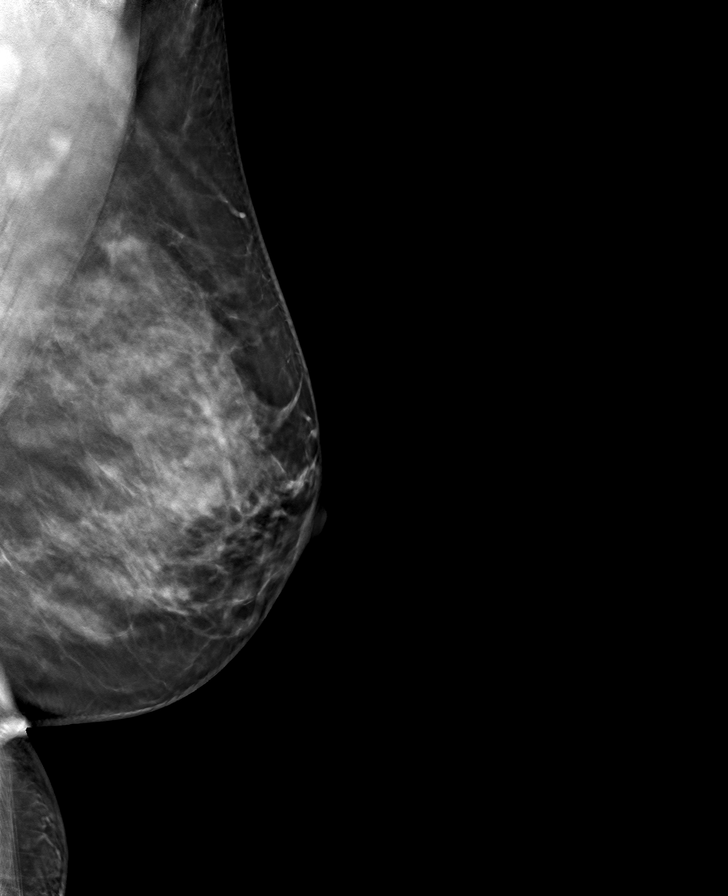

[R MLO tomo · tomo slice 37/74.0]
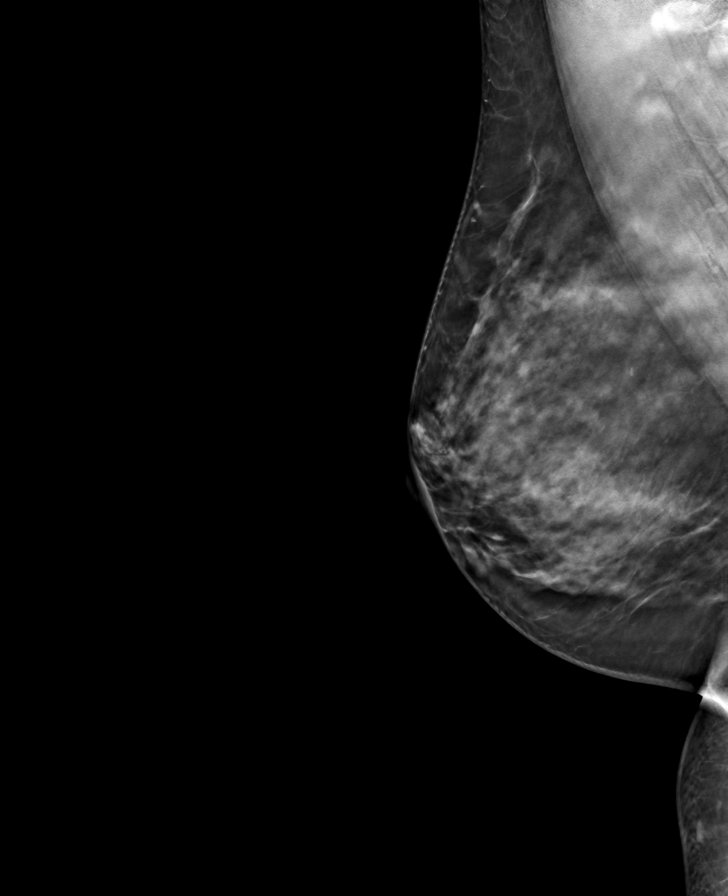

[R CC tomo · tomo slice 39/76.0]
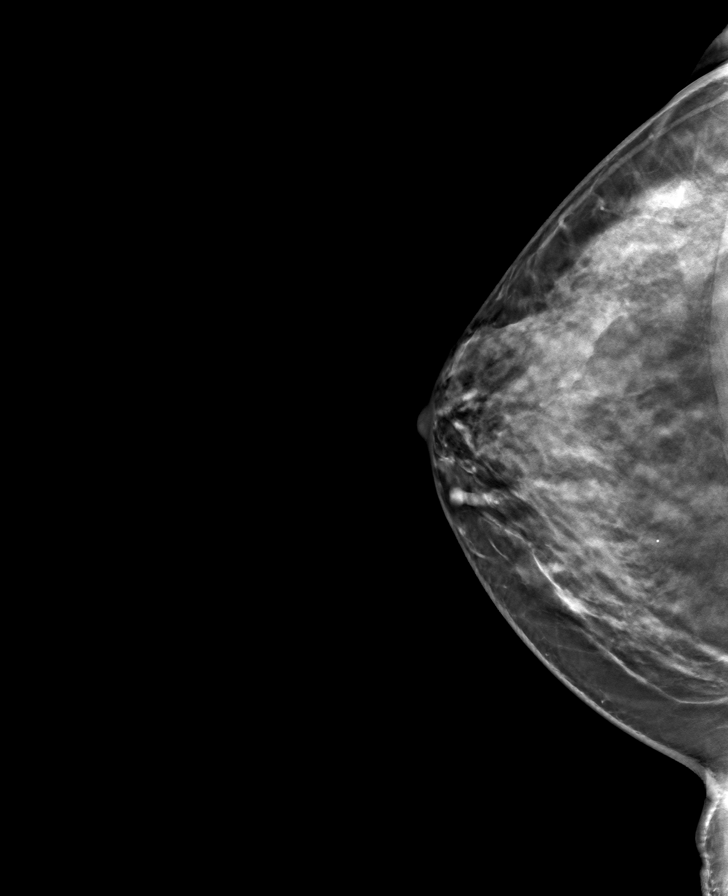

[L CC tomo · tomo slice 38/75.0]
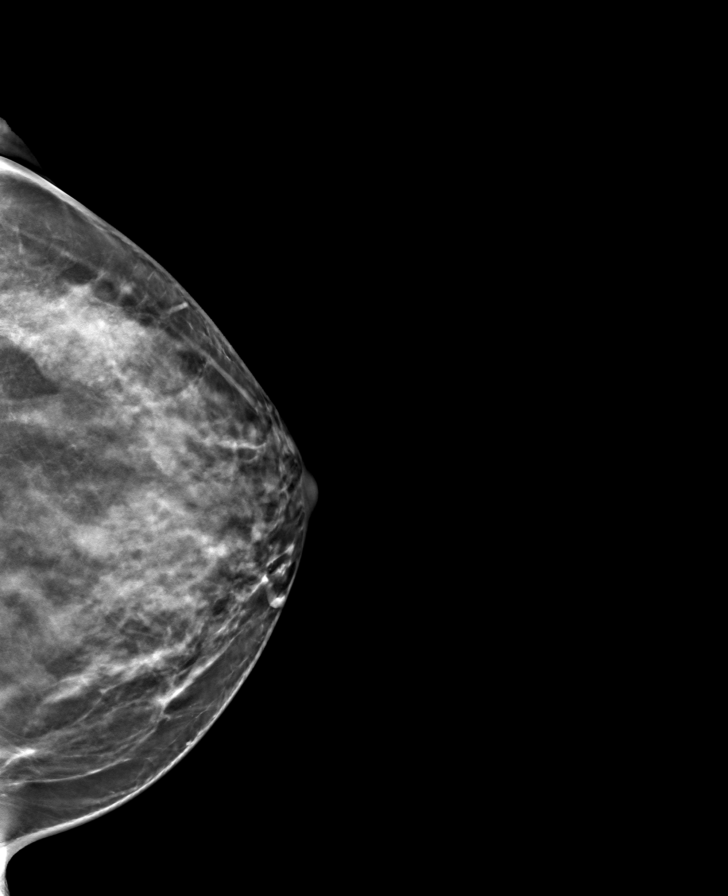

[8 of 24 positions shown; findings below may reference images not displayed]

ACR Breast Density Category c: The breast tissue is heterogeneously
dense, which may obscure small masses.
FINDINGS: There are no findings suspicious for malignancy.
IMPRESSION: No mammographic evidence of malignancy. A result letter of this
screening mammogram will be mailed directly to the patient.

RECOMMENDATION:
Screening mammogram in one year. (Code:Q3-W-BC3)

BI-RADS CATEGORY  1: Negative.
